# Patient Record
Sex: Female | Born: 1970
Health system: Southern US, Community
[De-identification: ages and names within clinical notes are randomized; demographics above are authoritative.]

## PROBLEM LIST (undated history)

## (undated) DIAGNOSIS — T4145XA Adverse effect of unspecified anesthetic, initial encounter: Secondary | ICD-10-CM

## (undated) DIAGNOSIS — N3010 Interstitial cystitis (chronic) without hematuria: Secondary | ICD-10-CM

## (undated) DIAGNOSIS — N289 Disorder of kidney and ureter, unspecified: Secondary | ICD-10-CM

## (undated) DIAGNOSIS — T8859XA Other complications of anesthesia, initial encounter: Secondary | ICD-10-CM

## (undated) DIAGNOSIS — R05 Cough: Secondary | ICD-10-CM

## (undated) DIAGNOSIS — Z9889 Other specified postprocedural states: Secondary | ICD-10-CM

## (undated) DIAGNOSIS — R112 Nausea with vomiting, unspecified: Secondary | ICD-10-CM

## (undated) DIAGNOSIS — K219 Gastro-esophageal reflux disease without esophagitis: Secondary | ICD-10-CM

## (undated) DIAGNOSIS — R053 Chronic cough: Secondary | ICD-10-CM

## (undated) DIAGNOSIS — F419 Anxiety disorder, unspecified: Secondary | ICD-10-CM

## (undated) DIAGNOSIS — H3320 Serous retinal detachment, unspecified eye: Secondary | ICD-10-CM

## (undated) HISTORY — DX: Chronic cough: R05.3

## (undated) HISTORY — DX: Anxiety disorder, unspecified: F41.9

## (undated) HISTORY — DX: Interstitial cystitis (chronic) without hematuria: N30.10

## (undated) HISTORY — DX: Cough: R05

## (undated) HISTORY — PX: EYE SURGERY: SHX253

---

## 1996-07-08 HISTORY — PX: APPENDECTOMY: SHX54

## 1998-01-12 ENCOUNTER — Other Ambulatory Visit: Admission: RE | Admit: 1998-01-12 | Discharge: 1998-01-12 | Payer: Self-pay | Admitting: Obstetrics and Gynecology

## 1999-01-26 ENCOUNTER — Other Ambulatory Visit: Admission: RE | Admit: 1999-01-26 | Discharge: 1999-01-26 | Payer: Self-pay | Admitting: Obstetrics and Gynecology

## 2000-02-15 ENCOUNTER — Other Ambulatory Visit: Admission: RE | Admit: 2000-02-15 | Discharge: 2000-02-15 | Payer: Self-pay | Admitting: Obstetrics and Gynecology

## 2000-06-01 ENCOUNTER — Emergency Department (HOSPITAL_COMMUNITY): Admission: EM | Admit: 2000-06-01 | Discharge: 2000-06-02 | Payer: Self-pay | Admitting: Emergency Medicine

## 2000-12-03 ENCOUNTER — Encounter: Payer: Self-pay | Admitting: Dermatology

## 2000-12-03 ENCOUNTER — Ambulatory Visit (HOSPITAL_COMMUNITY): Admission: RE | Admit: 2000-12-03 | Discharge: 2000-12-03 | Payer: Self-pay | Admitting: Dermatology

## 2000-12-17 ENCOUNTER — Encounter: Admission: RE | Admit: 2000-12-17 | Discharge: 2000-12-17 | Payer: Self-pay | Admitting: *Deleted

## 2000-12-17 ENCOUNTER — Encounter: Payer: Self-pay | Admitting: Allergy and Immunology

## 2001-02-28 ENCOUNTER — Emergency Department (HOSPITAL_COMMUNITY): Admission: EM | Admit: 2001-02-28 | Discharge: 2001-02-28 | Payer: Self-pay | Admitting: *Deleted

## 2001-03-10 ENCOUNTER — Other Ambulatory Visit: Admission: RE | Admit: 2001-03-10 | Discharge: 2001-03-10 | Payer: Self-pay | Admitting: Obstetrics and Gynecology

## 2001-06-23 ENCOUNTER — Ambulatory Visit (HOSPITAL_BASED_OUTPATIENT_CLINIC_OR_DEPARTMENT_OTHER): Admission: RE | Admit: 2001-06-23 | Discharge: 2001-06-23 | Payer: Self-pay | Admitting: *Deleted

## 2001-06-23 HISTORY — PX: OTHER SURGICAL HISTORY: SHX169

## 2002-03-31 ENCOUNTER — Encounter: Payer: Self-pay | Admitting: *Deleted

## 2002-03-31 ENCOUNTER — Encounter: Admission: RE | Admit: 2002-03-31 | Discharge: 2002-03-31 | Payer: Self-pay | Admitting: *Deleted

## 2002-04-14 ENCOUNTER — Other Ambulatory Visit: Admission: RE | Admit: 2002-04-14 | Discharge: 2002-04-14 | Payer: Self-pay | Admitting: Obstetrics and Gynecology

## 2003-05-04 ENCOUNTER — Other Ambulatory Visit: Admission: RE | Admit: 2003-05-04 | Discharge: 2003-05-04 | Payer: Self-pay | Admitting: Obstetrics and Gynecology

## 2004-03-28 ENCOUNTER — Emergency Department (HOSPITAL_COMMUNITY): Admission: EM | Admit: 2004-03-28 | Discharge: 2004-03-29 | Payer: Self-pay | Admitting: Emergency Medicine

## 2004-07-24 ENCOUNTER — Other Ambulatory Visit: Admission: RE | Admit: 2004-07-24 | Discharge: 2004-07-24 | Payer: Self-pay | Admitting: Obstetrics and Gynecology

## 2005-07-25 ENCOUNTER — Other Ambulatory Visit: Admission: RE | Admit: 2005-07-25 | Discharge: 2005-07-25 | Payer: Self-pay | Admitting: Obstetrics and Gynecology

## 2009-10-06 ENCOUNTER — Encounter: Admission: RE | Admit: 2009-10-06 | Discharge: 2009-10-06 | Payer: Self-pay | Admitting: Obstetrics and Gynecology

## 2010-04-30 ENCOUNTER — Encounter: Payer: Self-pay | Admitting: Obstetrics and Gynecology

## 2010-08-25 NOTE — H&P (Signed)
[facility]  Patient:    Debra Cain, Debra Cain Visit Number: [id number] MRN: [id number]          Service Type: Attending:  Radene Knee., M.D. Dictated by:   Radene Knee., M.D.                           History and Physical  DATE OF BIRTH:  12/11/1970  INTRODUCTION:  This patient, age 40, was initially seen in our office with a chief complaint of urgency and recurrent urinary tract infections, onset about six months.  She was felt to have interstitial cystitis and scheduled for cystoscopy, urethral dilation, and possible Clorpactin on June 23, 2001.  HISTORY OF PRESENT ILLNESS:  The patient did have urinary tract infections in the past, about 78.  These were treated with antibiotics, cleared up, and she did reasonably well until about six months ago.  Since then she has had recurrent episodes of urgency, frequency about every month and has been treated with antibiotics, most recently Macrobid, with only temporary improvement.  She gets up once a night, voids about every hour, has passed no gross blood, gravel, or stone.  When seen in our office her urinalysis was normal on June 19, 2001.  She was examined with ultrasound, which revealed no hydronephrosis right or left and was found to have a very tender bladder, advised to have cystoscopy and dilation under anesthesia, and this is scheduled for June 23, 2001, at [facility].  A urine culture is pending.  At this time she is to at continue her Macrobid.  ALLERGIES:  EPHEDRINE, MINOCIN, CIPRO, PENICILLIN, AUGMENTIN.  REGULAR MEDICATIONS:  Birth control pills.  Macrobid.  PAST MEDICAL HISTORY:  Unremarkable except for appendectomy.  FAMILY HISTORY:  No familial diseases to her knowledge.  Her father has recently had surgery for colitis.  He is in his 36s.  Mother living and well in her 43s.  One sibling, age 59.  To her knowledge no diabetes, high blood pressure, bleeders, or heart  disease in the family.  SOCIAL HISTORY:  She is married, has one child.  She uses a lot of caffeine. Does not use tobacco or alcohol.  She teaches clogging.  REVIEW OF SYSTEMS:  General health has been good.  Weight stable.  HEENT: Unremarkable except for headaches.  CARDIORESPIRATORY:  No chest pain or asthma.  GASTROINTESTINAL:  No hepatitis or peptic ulcer disease.  OB/GYN: One pregnancy.  Irregular menses, controlled with birth control pills. Menarche delayed to age 89.  EXTREMITIES:  No arthritis or edema. NEUROPSYCHIATRIC:  Unremarkable.  No fainting or falling-out spells.  Does have what sounds like tension headaches.  LYMPHATIC, HEMOPOIETIC:  No anemia or nodes.  SKIN:  No rashes.  She does have hives, however, with the above medications under allergies.  PHYSICAL EXAMINATION:  GENERAL:  Well-developed, well-nourished 40 year old female.  VITAL SIGNS:  Temperature 98, pulse 77, respirations 14, blood pressure 120/75.  Weight 111.  HEENT:  Ears and tympanic membranes unremarkable.  Eyes react normally to light and accommodation.  Extraocular movements intact.  Pharynx benign. Teeth in good repair.  NECK:  No enlargement of thyroid.  No nodes palpable.  CHEST:  Clear to percussion and auscultation.  HEART:  Normal sinus rhythm.  No murmur.  Not enlarged.  BREASTS:  Not examined.  ABDOMEN:  Flat.  There is suprapubic tenderness.  Liver, kidney, spleen, masses, hernia not detected.  GENITOURINARY:  External genitalia, meatus is normal.  She has good support to her bladder and urethra.  The bladder is quite tender.  RECTAL:  Rectal tone is good.  No masses noted.  EXTREMITIES:  No edema.  Good peripheral pulses.  NEUROLOGIC:  Grossly normal reflexes and sensation.  LYMPHATIC:  No nodes.  SKIN:  No lesions noted.  DIAGNOSES: 1. Recurrent urinary tract infections. 2. Chronic cystitis. 4. Rule out urethral stricture and interstitial cystitis.  PLAN:   Cystodilation, possible Clorpactin, Hermenia Fiscal, on June 23, 2001, 1 p.m.  Continue Macrobid 100 mg daily. Dictated by:   Radene Knee., M.D. Attending:  Radene Knee., M.D. DD:  06/19/01 TD:  06/20/01 Job: [id number]

## 2010-08-25 NOTE — Op Note (Signed)
[facility]  Patient:    Debra Cain, Debra Cain Visit Number: [id number] MRN: [id number]          Service Type: NES Location: [facility] Attending Physician:  Dalbert Mayotte Dictated by:   Radene Knee., M.D. Proc. Date: 06/23/01 Admit Date:  06/23/2001                             Operative Report  PREOPERATIVE DIAGNOSES:  Recurrent urinary tract infections, interstitial cystitis, urethral stricture.  POSTOPERATIVE DIAGNOSES:  Recurrent urinary tract infections, interstitial cystitis, urethral stricture.  OPERATION PERFORMED:  Cystoscopy, urethral dilation, hydrodistention and Clorpactin irrigation of bladder.  DESCRIPTION OF PROCEDURE:  This 40 year old female was prepared for surgery with IV gentamycin and underwent successful induction of general anesthesia and was prepped and draped in lithotomy position. Using female sounds, the urethra which was fairly tight was dilated 24-30 [place] with no significant bleeding. The bladder was then inspected with a #22 cystourethroscope using 70 and 12 degree lenses and initially the bladder looked surprisingly normal with only mild hyperemia and erythema. The right and left ureteral orifices were normal and there was no stone, tumor, nor ulcer noted. The bladder was then distended to capacity which was 500 ml and decompressed and she developed severe hyperemia and erythema throughout the bladder with large and small hemorrhages especially on the trigone in the anterior dome and on the lateral walls. The bladder was then emptied and irrigated with 1000 cc of Clorpactin solution and then emptied again and the patient returned to the recovery area in a stable condition.  The plan is for the patient to take Ceftin 250 mg for 2 or 3 days. She is to have Darvocet-N 100 to use p.r.n. for pain and she is to avoid caffeine forced fluids. She is to see [place] in the office and we will follow her up at that  time. ictated by:   Radene Knee., M.D. Attending Physician:  Dalbert Mayotte DD:  06/23/01 TD:  06/24/01 Job: [id number]

## 2011-06-04 ENCOUNTER — Ambulatory Visit: Payer: Self-pay | Admitting: Otolaryngology

## 2011-09-24 ENCOUNTER — Ambulatory Visit (INDEPENDENT_AMBULATORY_CARE_PROVIDER_SITE_OTHER): Payer: No Typology Code available for payment source | Admitting: Internal Medicine

## 2011-09-24 ENCOUNTER — Encounter: Payer: Self-pay | Admitting: Internal Medicine

## 2011-09-24 VITALS — BP 110/82 | HR 80 | Temp 98.4°F | Ht 65.0 in | Wt 123.0 lb

## 2011-09-24 DIAGNOSIS — T17308A Unspecified foreign body in larynx causing other injury, initial encounter: Secondary | ICD-10-CM | POA: Insufficient documentation

## 2011-09-24 DIAGNOSIS — F419 Anxiety disorder, unspecified: Secondary | ICD-10-CM

## 2011-09-24 DIAGNOSIS — F411 Generalized anxiety disorder: Secondary | ICD-10-CM

## 2011-09-24 DIAGNOSIS — N3010 Interstitial cystitis (chronic) without hematuria: Secondary | ICD-10-CM | POA: Insufficient documentation

## 2011-09-24 DIAGNOSIS — IMO0002 Reserved for concepts with insufficient information to code with codable children: Secondary | ICD-10-CM

## 2011-09-24 NOTE — Progress Notes (Signed)
  Subjective:    Patient ID: Debra Cain, female    DOB: 1970/08/31, 41 y.o.   MRN: [id number]  HPI  New pt to me - here to establish care - feels overall well Also follow with ob gyn, [facility] annually - all labs done thru their office  Reviewed chronic medical issues: Anxiety - on SNRI for same - feels stable on current dose - managed by gyn - no SI/HI, no irritability  Hx IC - symptoms stable at this time - on suppressive antibiotics (also for acne) - frequent UTIs but improved in past 5 years  Chronic cough/"choking" = has been evaluated by ENT and [facility] for same - ?related to allergies - not improved with daily PPI - no other anatomic issues found - eval planned by ST    Past Medical History  Diagnosis Date  . Chronic interstitial cystitis   . Anxiety    History reviewed. No pertinent family history.  History  Substance Use Topics  . Smoking status: Never Smoker   . Smokeless tobacco: Not on file  . Alcohol Use: No    Review of Systems Constitutional: Negative for fever or weight change.  Respiratory: Negative for cough and shortness of breath.   Cardiovascular: Negative for chest pain or palpitations.  Gastrointestinal: Negative for abdominal pain, no bowel changes.  Musculoskeletal: Negative for gait problem or joint swelling.  Skin: Negative for rash.  Neurological: Negative for dizziness or headache.  No other specific complaints in a complete review of systems (except as listed in HPI above).     Objective:   Physical Exam BP 110/82  Pulse 80  Temp 98.4 F (36.9 C) (Oral)  Ht 5\' 5"  (1.651 m)  Wt 123 lb (55.792 kg)  BMI 20.47 kg/m2  SpO2 94% Wt Readings from Last 3 Encounters:  09/24/11 123 lb (55.792 kg)   Constitutional: She appears well-developed and well-nourished. No distress. g-mother at side HENT: Head: Normocephalic and atraumatic. Ears: B TMs ok, no erythema or effusion; Nose: Nose normal. Mouth/Throat: Oropharynx is clear and moist. No  oropharyngeal exudate.  Eyes: Conjunctivae and EOM are normal. Pupils are equal, round, and reactive to light. No scleral icterus.  Neck: Normal range of motion. Neck supple. No JVD present. No thyromegaly present.  Cardiovascular: Normal rate, regular rhythm and normal heart sounds.  No murmur heard. No BLE edema. Pulmonary/Chest: Effort normal and breath sounds normal. No respiratory distress. She has no wheezes.  Musculoskeletal: Normal range of motion, no joint effusions. No gross deformities Neurological: She is alert and oriented to person, place, and time. No cranial nerve deficit. Coordination normal.  Skin: Skin is warm and dry. No rash noted. No erythema.  Psychiatric: She has a normal mood and affect. Her behavior is normal. Judgment and thought content normal.   No results found for this basename: WBC,  HGB,  HCT,  PLT,  GLUCOSE,  CHOL,  TRIG,  HDL,  LDLDIRECT,  LDLCALC,  ALT,  AST,  NA,  K,  CL,  CREATININE,  BUN,  CO2,  TSH,  PSA,  INR,  GLUF,  HGBA1C,  MICROALBUR       Assessment & Plan:  See problem list. Medications and labs reviewed today.

## 2011-09-24 NOTE — Assessment & Plan Note (Signed)
On SNRI since 2011 - managed by gyn -  The current medical regimen is effective;  continue present plan and medications.

## 2011-09-24 NOTE — Assessment & Plan Note (Signed)
Stable symptoms - UTI and flares managed by gyn at this time Remote uro procedure in 2003, no regular uro follow up The current medical regimen is effective;  continue present plan and medications.

## 2011-09-24 NOTE — Assessment & Plan Note (Signed)
Chronic cough/"choking" = has been evaluated by ENT and [facility] for same - ?related to allergies -  not improved with daily PPI - no other anatomic issues found - eval planned by ST  Support offered - consider pulm eval if desired in future

## 2011-09-24 NOTE — Patient Instructions (Signed)
It was good to see you today. We have reviewed your prior records including labs and tests today Medications reviewed, no changes at this time. Please schedule followup in 1-2 years for medical review and/or labs, call sooner if problems.

## 2012-02-20 ENCOUNTER — Ambulatory Visit (INDEPENDENT_AMBULATORY_CARE_PROVIDER_SITE_OTHER): Payer: No Typology Code available for payment source | Admitting: Internal Medicine

## 2012-02-20 ENCOUNTER — Other Ambulatory Visit (INDEPENDENT_AMBULATORY_CARE_PROVIDER_SITE_OTHER): Payer: No Typology Code available for payment source

## 2012-02-20 ENCOUNTER — Encounter: Payer: Self-pay | Admitting: Internal Medicine

## 2012-02-20 VITALS — BP 100/72 | HR 75 | Temp 98.3°F | Ht 65.0 in | Wt 122.0 lb

## 2012-02-20 DIAGNOSIS — R5381 Other malaise: Secondary | ICD-10-CM

## 2012-02-20 DIAGNOSIS — D649 Anemia, unspecified: Secondary | ICD-10-CM

## 2012-02-20 DIAGNOSIS — R5383 Other fatigue: Secondary | ICD-10-CM

## 2012-02-20 LAB — BASIC METABOLIC PANEL
Calcium: 9.2 mg/dL (ref 8.4–10.5)
Chloride: 101 mEq/L (ref 96–112)
Creatinine, Ser: 0.5 mg/dL (ref 0.4–1.2)

## 2012-02-20 LAB — CBC WITH DIFFERENTIAL/PLATELET
Basophils Relative: 0.4 % (ref 0.0–3.0)
Eosinophils Relative: 1.1 % (ref 0.0–5.0)
Lymphocytes Relative: 34.4 % (ref 12.0–46.0)
Monocytes Relative: 6.4 % (ref 3.0–12.0)
Neutrophils Relative %: 57.7 % (ref 43.0–77.0)
RBC: 4.71 Mil/uL (ref 3.87–5.11)
WBC: 6.5 10*3/uL (ref 4.5–10.5)

## 2012-02-20 LAB — HEPATIC FUNCTION PANEL
AST: 15 U/L (ref 0–37)
Albumin: 4.2 g/dL (ref 3.5–5.2)
Alkaline Phosphatase: 66 U/L (ref 39–117)
Total Protein: 7.3 g/dL (ref 6.0–8.3)

## 2012-02-20 LAB — FERRITIN: Ferritin: 26.4 ng/mL (ref 10.0–291.0)

## 2012-02-20 NOTE — Patient Instructions (Signed)
It was good to see you today. Test(s) ordered today. Your results will be released to MyChart (or called to you) after review, usually within 72hours after test completion. If any changes need to be made, you will be notified at that same time. Medications reviewed, no changes at this time. Will consider prescription iron or b12 shots if labs show low levels

## 2012-02-20 NOTE — Progress Notes (Signed)
  Subjective:    Patient ID: Debra Cain, female    DOB: 1971/02/27, 41 y.o.   MRN: [id number]  HPI   complains of fatigue - physical > emotional Ongoing >3 mo associated with hypersomnia, poor appetite Hx anemia with B12 and iron deficiency - ?recheck now Denies SI/HI or depression symptoms   Reviewed chronic medical issues: Anxiety - on SNRI for same - feels stable on current dose - managed by gyn - denies irritability  Hx IC - symptoms stable at this time - on suppressive antibiotics (also for acne) - frequent UTIs but improved in past 5 years  Chronic cough/"choking" = has been evaluated by ENT and [facility] for same - ?related to allergies - not improved with daily PPI - no other anatomic issues found -   Past Medical History  Diagnosis Date  . Chronic interstitial cystitis   . Anxiety     Review of Systems  Constitutional: Negative for fever or weight change. complains of fatigue Respiratory: Negative for cough and shortness of breath.   Cardiovascular: Negative for chest pain or palpitations.      Objective:   Physical Exam  BP 100/72  Pulse 75  Temp 98.3 F (36.8 C) (Oral)  Ht 5\' 5"  (1.651 m)  Wt 122 lb (55.339 kg)  BMI 20.30 kg/m2  SpO2 97% Wt Readings from Last 3 Encounters:  02/20/12 122 lb (55.339 kg)  09/24/11 123 lb (55.792 kg)   Constitutional: She appears well-developed and well-nourished. No distress. Neck: Normal range of motion. Neck supple. No JVD present. No thyromegaly present.  Cardiovascular: Normal rate, regular rhythm and normal heart sounds.  No murmur heard. No BLE edema. Pulmonary/Chest: Effort normal and breath sounds normal. No respiratory distress. She has no wheezes.  Skin: Skin is warm and dry. No rash noted. No erythema.  Psychiatric: She has a normal mood and affect. Her behavior is normal. Judgment and thought content normal.   No results found for this basename: WBC,  HGB,  HCT,  PLT,  GLUCOSE,  CHOL,  TRIG,  HDL,  LDLDIRECT,   LDLCALC,  ALT,  AST,  NA,  K,  CL,  CREATININE,  BUN,  CO2,  TSH,  PSA,  INR,  GLUF,  HGBA1C,  MICROALBUR   No results found for this basename: VITAMINB12       Assessment & Plan:   Fatigue - nonspecific symptoms/exam - check screening labs, reports hx B12 and iron deficiency with anemia   Anemia - hx iron def and B12 def - check levels now

## 2012-02-21 LAB — VITAMIN D 25 HYDROXY (VIT D DEFICIENCY, FRACTURES): Vit D, 25-Hydroxy: 42 ng/mL (ref 30–89)

## 2012-04-12 ENCOUNTER — Emergency Department: Payer: Self-pay | Admitting: Emergency Medicine

## 2012-04-12 LAB — URINALYSIS, COMPLETE
Bilirubin,UR: NEGATIVE
Leukocyte Esterase: NEGATIVE
RBC,UR: 1 /HPF (ref 0–5)
Specific Gravity: 1.006 (ref 1.003–1.030)
Squamous Epithelial: 2
WBC UR: <1 /HPF (ref 0–5)

## 2012-04-12 LAB — COMPREHENSIVE METABOLIC PANEL
Albumin: 4.6 g/dL (ref 3.4–5.0)
Alkaline Phosphatase: 102 U/L (ref 50–136)
Chloride: 101 mmol/L (ref 98–107)
Co2: 22 mmol/L (ref 21–32)
Creatinine: 0.81 mg/dL (ref 0.60–1.30)
EGFR (African American): >60
EGFR (Non-African Amer.): >60
Osmolality: 273 (ref 275–301)
Potassium: 3.2 mmol/L — ABNORMAL LOW (ref 3.5–5.1)
SGPT (ALT): 16 U/L (ref 12–78)
Sodium: 136 mmol/L (ref 136–145)
Total Protein: 8.4 g/dL — ABNORMAL HIGH (ref 6.4–8.2)

## 2012-04-12 LAB — CBC
HGB: 14.9 g/dL (ref 12.0–16.0)
MCH: 30.2 pg (ref 26.0–34.0)
MCHC: 36.4 g/dL — ABNORMAL HIGH (ref 32.0–36.0)
MCV: 83 fL (ref 80–100)
Platelet: 347 10*3/uL (ref 150–440)
RBC: 4.92 10*6/uL (ref 3.80–5.20)
RDW: 12 % (ref 11.5–14.5)
WBC: 7 10*3/uL (ref 3.6–11.0)

## 2012-04-12 LAB — TROPONIN I: Troponin-I: <0.02 ng/mL

## 2012-04-13 LAB — TSH: Thyroid Stimulating Horm: 2.1 u[IU]/mL

## 2012-04-21 ENCOUNTER — Ambulatory Visit (INDEPENDENT_AMBULATORY_CARE_PROVIDER_SITE_OTHER): Payer: No Typology Code available for payment source | Admitting: Internal Medicine

## 2012-04-21 ENCOUNTER — Encounter: Payer: Self-pay | Admitting: Internal Medicine

## 2012-04-21 VITALS — BP 112/78 | HR 90 | Temp 98.2°F | Ht 65.0 in | Wt 124.6 lb

## 2012-04-21 DIAGNOSIS — F411 Generalized anxiety disorder: Secondary | ICD-10-CM

## 2012-04-21 DIAGNOSIS — F419 Anxiety disorder, unspecified: Secondary | ICD-10-CM

## 2012-04-21 MED ORDER — ALPRAZOLAM 0.5 MG PO TABS: 0.5000 mg | ORAL_TABLET | Freq: Two times a day (BID) | ORAL | Status: DC | PRN

## 2012-04-21 NOTE — Assessment & Plan Note (Signed)
On SNRI since 2011 - managed by gyn -  Increase symptoms with panic attacks 03/2012 during holiday, triggered by family stressors Panic attack with ER visit at [facility] reviewed Start low dose xanax as needed in addition to ongoing SNRI (unable to tol higher dose SNRI due to weight gain and nausea) Offered counseling and pt denies need for same at this time due to ineffective prior trials Support offered

## 2012-04-21 NOTE — Patient Instructions (Signed)
It was good to see you today. We have reviewed your prior records including labs and tests today Medications reviewed, use xanax as needed for anxiety/panic attack symptoms - Your prescription(s) have been submitted to your pharmacy. Please take as directed and contact our office if you believe you are having problem(s) with the medication(s). Continue daily effexor for "baseline" control Please schedule followup in 6 months, call sooner if problems.    Anxiety and Panic Attacks Your caregiver has informed you that you are having an anxiety or panic attack. There may be many forms of this. Most of the time these attacks come suddenly and without warning. They come at any time of day, including periods of sleep, and at any time of life. They may be strong and unexplained. Although panic attacks are very scary, they are physically harmless. Sometimes the cause of your anxiety is not known. Anxiety is a protective mechanism of the body in its fight or flight mechanism. Most of these perceived danger situations are actually nonphysical situations (such as anxiety over losing a job). CAUSES   The causes of an anxiety or panic attack are many. Panic attacks may occur in otherwise healthy people given a certain set of circumstances. There may be a genetic cause for panic attacks. Some medications may also have anxiety as a side effect. SYMPTOMS   Some of the most common feelings are:  Intense terror.   Dizziness, feeling faint.   Hot and cold flashes.   Fear of going crazy.   Feelings that nothing is real.   Sweating.   Shaking.   Chest pain or a fast heartbeat (palpitations).   Smothering, choking sensations.   Feelings of impending doom and that death is near.   Tingling of extremities, this may be from over-breathing.   Altered reality (derealization).   Being detached from yourself (depersonalization).  Several symptoms can be present to make up anxiety or panic attacks. DIAGNOSIS    The evaluation by your caregiver will depend on the type of symptoms you are experiencing. The diagnosis of anxiety or panic attack is made when no physical illness can be determined to be a cause of the symptoms. TREATMENT   Treatment to prevent anxiety and panic attacks may include:  Avoidance of circumstances that cause anxiety.   Reassurance and relaxation.   Regular exercise.   Relaxation therapies, such as yoga.   Psychotherapy with a psychiatrist or therapist.   Avoidance of caffeine, alcohol and illegal drugs.   Prescribed medication.  SEEK IMMEDIATE MEDICAL CARE IF:    You experience panic attack symptoms that are different than your usual symptoms.   You have any worsening or concerning symptoms.  Document Released: 03/26/2005 Document Revised: 06/18/2011 Document Reviewed: 07/28/2009 [facility] Patient Information 2013 [place], [place].

## 2012-04-21 NOTE — Progress Notes (Signed)
  Subjective:    Patient ID: Debra Cain, female    DOB: [birth year], 42 y.o.   MRN: [id number]  HPI  Here for follow up - reviewed chronic medical issues:  Anxiety - on SNRI for same since 2010 - exacerbated by family stressors over holiday 2013, but feels stable overall on current dose - managed by gyn - denies irritability - prior counseling events ineffective  Hx IC - symptoms stable at this time - on suppressive antibiotics (also for acne) - frequent UTIs but improved in past 5 years  Chronic cough/"choking" = has been evaluated by ENT and [facility] for same - ?related to allergies - not improved with daily PPI - no other anatomic issues found -   Past Medical History  Diagnosis Date  . Chronic interstitial cystitis   . Anxiety     Review of Systems  Constitutional: Negative for fever or weight change. Respiratory: Negative for cough and shortness of breath.   Cardiovascular: Negative for chest pain or palpitations.      Objective:   Physical Exam  BP 112/78  Pulse 90  Temp 98.2 F (36.8 C) (Oral)  Ht 5\' 5"  (1.651 m)  Wt 124 lb 9.6 oz (56.518 kg)  BMI 20.73 kg/m2  SpO2 94% Wt Readings from Last 3 Encounters:  04/21/12 124 lb 9.6 oz (56.518 kg)  02/20/12 122 lb (55.339 kg)  09/24/11 123 lb (55.792 kg)   Constitutional: She appears well-developed and well-nourished. No distress. Neck: Normal range of motion. Neck supple. No JVD present. No thyromegaly present.  Cardiovascular: Normal rate, regular rhythm and normal heart sounds.  No murmur heard. No BLE edema. Pulmonary/Chest: Effort normal and breath sounds normal. No respiratory distress. She has no wheezes.  Skin: Skin is warm and dry. No rash noted. No erythema.  Psychiatric: She has a anxious and tearful mood and affect. Her behavior is normal. Judgment and thought content normal.   Lab Results  Component Value Date   WBC 6.5 02/20/2012   HGB 13.6 02/20/2012   HCT 41.3 02/20/2012   PLT 323.0 02/20/2012   GLUCOSE 115* 02/20/2012   ALT 11 02/20/2012   AST 15 02/20/2012   NA 136 02/20/2012   K 4.6 02/20/2012   CL 101 02/20/2012   CREATININE 0.5 02/20/2012   BUN 11 02/20/2012   CO2 28 02/20/2012   TSH 1.14 02/20/2012    Lab Results  Component Value Date   VITAMINB12 962* 02/20/2012       Assessment & Plan:   See problem list. Medications and labs reviewed today.

## 2013-09-29 ENCOUNTER — Telehealth: Payer: Self-pay | Admitting: *Deleted

## 2013-09-29 NOTE — Telephone Encounter (Signed)
Left msg on triage requesting referral to see Dr. Melvyn Novas concerning her choking issues. My sister has been referred to him and he has help her out with sxs. Called pt back inform her she will need to see PCP b4 referral can be made. Made appt for 7/13...Johny Chess

## 2013-09-30 ENCOUNTER — Other Ambulatory Visit: Payer: Self-pay | Admitting: *Deleted

## 2013-09-30 MED ORDER — ALPRAZOLAM 0.5 MG PO TABS: 0.5000 mg | ORAL_TABLET | Freq: Two times a day (BID) | ORAL | Status: DC | PRN

## 2013-09-30 NOTE — Telephone Encounter (Signed)
MD out of office. Pls advise on refill.../lmb 

## 2013-09-30 NOTE — Telephone Encounter (Signed)
Done hardcopy to robin  

## 2013-09-30 NOTE — Telephone Encounter (Signed)
Faxed hardcopy to [employer]

## 2013-10-19 ENCOUNTER — Ambulatory Visit (INDEPENDENT_AMBULATORY_CARE_PROVIDER_SITE_OTHER): Payer: No Typology Code available for payment source | Admitting: Internal Medicine

## 2013-10-19 ENCOUNTER — Encounter: Payer: Self-pay | Admitting: Internal Medicine

## 2013-10-19 ENCOUNTER — Other Ambulatory Visit (INDEPENDENT_AMBULATORY_CARE_PROVIDER_SITE_OTHER): Payer: No Typology Code available for payment source

## 2013-10-19 VITALS — BP 110/80 | HR 59 | Temp 98.1°F | Ht 65.0 in | Wt 126.0 lb

## 2013-10-19 DIAGNOSIS — Z5189 Encounter for other specified aftercare: Secondary | ICD-10-CM

## 2013-10-19 DIAGNOSIS — R5383 Other fatigue: Secondary | ICD-10-CM

## 2013-10-19 DIAGNOSIS — F411 Generalized anxiety disorder: Secondary | ICD-10-CM

## 2013-10-19 DIAGNOSIS — F419 Anxiety disorder, unspecified: Secondary | ICD-10-CM

## 2013-10-19 DIAGNOSIS — R5381 Other malaise: Secondary | ICD-10-CM

## 2013-10-19 DIAGNOSIS — Z136 Encounter for screening for cardiovascular disorders: Secondary | ICD-10-CM

## 2013-10-19 DIAGNOSIS — T17308D Unspecified foreign body in larynx causing other injury, subsequent encounter: Secondary | ICD-10-CM

## 2013-10-19 DIAGNOSIS — T17308A Unspecified foreign body in larynx causing other injury, initial encounter: Secondary | ICD-10-CM

## 2013-10-19 LAB — CBC WITH DIFFERENTIAL/PLATELET
BASOS ABS: 0 10*3/uL (ref 0.0–0.1)
Basophils Relative: 0.6 % (ref 0.0–3.0)
EOS ABS: 0.1 10*3/uL (ref 0.0–0.7)
Eosinophils Relative: 1.5 % (ref 0.0–5.0)
HCT: 39.8 % (ref 36.0–46.0)
Hemoglobin: 13.1 g/dL (ref 12.0–15.0)
Lymphocytes Relative: 24.4 % (ref 12.0–46.0)
Lymphs Abs: 1.8 10*3/uL (ref 0.7–4.0)
MCHC: 33 g/dL (ref 30.0–36.0)
MCV: 85.5 fl (ref 78.0–100.0)
MONO ABS: 0.5 10*3/uL (ref 0.1–1.0)
Monocytes Relative: 7.3 % (ref 3.0–12.0)
NEUTROS PCT: 66.2 % (ref 43.0–77.0)
Neutro Abs: 4.8 10*3/uL (ref 1.4–7.7)
Platelets: 329 10*3/uL (ref 150.0–400.0)
RBC: 4.65 Mil/uL (ref 3.87–5.11)
RDW: 12.1 % (ref 11.5–15.5)
WBC: 7.3 10*3/uL (ref 4.0–10.5)

## 2013-10-19 MED ORDER — OMEPRAZOLE 20 MG PO CPDR: 20.0000 mg | DELAYED_RELEASE_CAPSULE | Freq: Every day | ORAL | Status: DC

## 2013-10-19 MED ORDER — LORATADINE 10 MG PO TABS: 10.0000 mg | ORAL_TABLET | Freq: Every day | ORAL | Status: DC

## 2013-10-19 NOTE — Patient Instructions (Signed)
It was good to see you today.  We have reviewed your prior records including labs and tests today  Test(s) ordered today. Your results will be released to [place] (or called to you) after review, usually within 72hours after test completion. If any changes need to be made, you will be notified at that same time.  Medications reviewed and updated, no changes recommended at this time.  Please schedule followup in 6-12 months, call sooner if problems.

## 2013-10-19 NOTE — Assessment & Plan Note (Signed)
On SNRI since 2011, changed to SSRI low ose sertraline due to weight gain - managed by gyn -  Increase symptoms with panic attacks 03/2012 during holiday, triggered by family stressors (panic attack with ER visit at [facility] reviewed) rx'd low dose xanax as needed 04/2013 - infrequent use of same Offered counseling and pt denies need for same at this time due to ineffective prior trials

## 2013-10-19 NOTE — Assessment & Plan Note (Signed)
Chronic cough/"choking" = has been evaluated by ENT, ST at [facility] for same - ?related to allergies -  Reports not previosuly improved with daily PPI or antihistamines - no other anatomic issues found per report To resume same (PPI and claritin) daily and refer to Dr Melvyn Novas as per request  Support offered -

## 2013-10-19 NOTE — Progress Notes (Signed)
Pre visit review using our clinic review tool, if applicable. No additional management support is needed unless otherwise documented below in the visit note. 

## 2013-10-19 NOTE — Progress Notes (Signed)
Subjective:    Patient ID: Debra Cain, female    DOB: [birth year], 43 y.o.   MRN: [id number]  HPI  Patient is here for follow up  Reviewed chronic medical issues and interval medical events  Past Medical History  Diagnosis Date  . Chronic interstitial cystitis   . Anxiety     Review of Systems  Constitutional: Positive for chills and fatigue. Negative for fever.  Respiratory: Positive for choking (request Dr Melvyn Novas to eval same - prior ENT eval unremarkable and PPI not helpful). Negative for shortness of breath.   Cardiovascular: Negative for leg swelling.  Neurological: Positive for numbness.  Psychiatric/Behavioral: Negative for suicidal ideas and self-injury. The patient is nervous/anxious.        Objective:   Physical Exam  BP 110/80  Pulse 59  Temp(Src) 98.1 F (36.7 C) (Oral)  Ht 5\' 5"  (1.651 m)  Wt 126 lb (57.153 kg)  BMI 20.97 kg/m2  SpO2 99% Wt Readings from Last 3 Encounters:  10/19/13 126 lb (57.153 kg)  04/21/12 124 lb 9.6 oz (56.518 kg)  02/20/12 122 lb (55.339 kg)   Constitutional: She appears well-developed and well-nourished. No distress.  Neck: Normal range of motion. Neck supple. No JVD present. No thyromegaly present.  Cardiovascular: Normal rate, regular rhythm and normal heart sounds.  No murmur heard. No BLE edema. Pulmonary/Chest: Effort normal and breath sounds normal. No respiratory distress. She has no wheezes.  Psychiatric: She has an anxious mood and affect. Her behavior is normal. Judgment and thought content normal.   Lab Results  Component Value Date   WBC 6.5 02/20/2012   HGB 13.6 02/20/2012   HCT 41.3 02/20/2012   PLT 323.0 02/20/2012   GLUCOSE 115* 02/20/2012   ALT 11 02/20/2012   AST 15 02/20/2012   NA 136 02/20/2012   K 4.6 02/20/2012   CL 101 02/20/2012   CREATININE 0.5 02/20/2012   BUN 11 02/20/2012   CO2 28 02/20/2012   TSH 1.14 02/20/2012    US Breast Right  10/06/2009   Clinical Data:  Right breast nodule 10  o'clock position. In the office today, the patient describes a large area in the 10 o'clock region of the right breast which she feels is asymmetric compared to the left breast.   DIGITAL DIAGNOSTIC BILATERAL MAMMOGRAM WITH CAD AND RIGHT BREAST ULTRASOUND:   Comparison:  None.   Findings:  Breast parenchyma is extremely dense bilaterally.  In the region of palpable concern, on a spot tangential view, there is suggestion of a obscured oval mass in the superficial aspect of the breast parenchyma.   No other mass is identified in either breast.  No suspicious microcalcification or distortion is seen. Mammographic images were processed with CAD.   On physical exam, dense breast parenchyma is palpated in the upper outer quadrant of the right breast.  The 10 o'clock region is slightly firm compared to the left breast.  I do not palpate a discrete lump.   Ultrasound is performed, showing  an oval homogeneous hypoechoic lobulated mass with some internal echogenic bands at 9:30 position, approximately 3 cm from the nipple that measures 1.1 x 0.5 x 1.0 cm.  There is posterior acoustic enhancement.  This has imaging features suggestive of a benign fibroadenoma. This mass is not easily palpable.   IMPRESSION: Probable 1.1 cm fibroadenoma 9:30 position right breast.  The options of follow-up ultrasound in 6, 12 and 24 months to assess for stability, versus ultrasound-guided core needle biopsy  versus surgical excision were discussed with the patient. She opts for 6 month follow-up ultrasound at this time.   Right breast ultrasound in 6 months is recommended.   BI-RADS CATEGORY 3:  Probably benign finding(s) - short interval follow-up suggested.  Provider: Lizabeth Leyden  Mm Digital Diagnostic Bilat  10/06/2009   Clinical Data:  Right breast nodule 10 o'clock position. In the office today, the patient describes a large area in the 10 o'clock region of the right breast which she feels is asymmetric compared to the left breast.    DIGITAL DIAGNOSTIC BILATERAL MAMMOGRAM WITH CAD AND RIGHT BREAST ULTRASOUND:   Comparison:  None.   Findings:  Breast parenchyma is extremely dense bilaterally.  In the region of palpable concern, on a spot tangential view, there is suggestion of a obscured oval mass in the superficial aspect of the breast parenchyma.   No other mass is identified in either breast.  No suspicious microcalcification or distortion is seen. Mammographic images were processed with CAD.   On physical exam, dense breast parenchyma is palpated in the upper outer quadrant of the right breast.  The 10 o'clock region is slightly firm compared to the left breast.  I do not palpate a discrete lump.   Ultrasound is performed, showing  an oval homogeneous hypoechoic lobulated mass with some internal echogenic bands at 9:30 position, approximately 3 cm from the nipple that measures 1.1 x 0.5 x 1.0 cm.  There is posterior acoustic enhancement.  This has imaging features suggestive of a benign fibroadenoma. This mass is not easily palpable.   IMPRESSION: Probable 1.1 cm fibroadenoma 9:30 position right breast.  The options of follow-up ultrasound in 6, 12 and 24 months to assess for stability, versus ultrasound-guided core needle biopsy versus surgical excision were discussed with the patient. She opts for 6 month follow-up ultrasound at this time.   Right breast ultrasound in 6 months is recommended.   BI-RADS CATEGORY 3:  Probably benign finding(s) - short interval follow-up suggested.  Provider: Siri Cole      Assessment & Plan:   Fatigue - overlap with "tingling all over on exertion, esp hands with clogging", "choking" spells and poor sleep - appears anxious but otherwise nonspecific exam - check screening labs - consider neuro eval   Reports prior "complete hormone deficiency" requiring BCP and testosterone replacement - but has been off same > 59mo - recheck labs now and consider endo eval if abnormal  Tingle/numbness -esp hands  with clogging - consider neuro or c-spine eval if labs abnormal  Problem List Items Addressed This Visit   Anxiety     On SNRI since 2011, changed to SSRI low ose sertraline due to weight gain - managed by gyn -  Increase symptoms with panic attacks 03/2012 during holiday, triggered by family stressors (panic attack with ER visit at [facility] reviewed) rx'd low dose xanax as needed 04/2013 - infrequent use of same Offered counseling and pt denies need for same at this time due to ineffective prior trials    Relevant Medications      sertraline (ZOLOFT) 25 MG tablet   Choking     Chronic cough/"choking" = has been evaluated by ENT, [facility] at [facility] for same - ?related to allergies -  Reports not previosuly improved with daily PPI or antihistamines - no other anatomic issues found per report To resume same (PPI and claritin) daily and refer to Dr Melvyn Novas as per request  Support offered -  Relevant Orders      Ambulatory referral to Pulmonology    Other Visit Diagnoses   Other fatigue    -  Primary    Relevant Orders       Basic metabolic panel       CBC with Differential       Hepatic function panel       TSH       Vitamin B12       Ferritin       Testosterone, free, total       Follicle stimulating hormone       DHEA-sulfate    Screening for ischemic heart disease        Relevant Orders       Lipid panel

## 2013-10-20 LAB — BASIC METABOLIC PANEL
BUN: 11 mg/dL (ref 6–23)
CALCIUM: 9 mg/dL (ref 8.4–10.5)
CO2: 26 meq/L (ref 19–32)
CREATININE: 0.5 mg/dL (ref 0.4–1.2)
Chloride: 105 mEq/L (ref 96–112)
GFR: 140.86 mL/min (ref >60.00)
Glucose, Bld: 87 mg/dL (ref 70–99)
Potassium: 4.1 mEq/L (ref 3.5–5.1)
Sodium: 139 mEq/L (ref 135–145)

## 2013-10-20 LAB — VITAMIN B12: VITAMIN B 12: 402 pg/mL (ref 211–911)

## 2013-10-20 LAB — HEPATIC FUNCTION PANEL
ALT: 13 U/L (ref 0–35)
AST: 17 U/L (ref 0–37)
Albumin: 4.1 g/dL (ref 3.5–5.2)
Alkaline Phosphatase: 68 U/L (ref 39–117)
BILIRUBIN DIRECT: 0.1 mg/dL (ref 0.0–0.3)
Total Bilirubin: 0.5 mg/dL (ref 0.2–1.2)
Total Protein: 7.3 g/dL (ref 6.0–8.3)

## 2013-10-20 LAB — TESTOSTERONE, FREE, TOTAL, SHBG
Sex Hormone Binding: 75 nmol/L (ref 18–114)
Testosterone, Free: 4.6 pg/mL (ref 0.6–6.8)
Testosterone-% Free: 1 % (ref 0.4–2.4)
Testosterone: 45 ng/dL (ref 10–70)

## 2013-10-20 LAB — LIPID PANEL
CHOLESTEROL: 166 mg/dL (ref 0–200)
HDL: 47.9 mg/dL (ref >39.00)
LDL Cholesterol: 110 mg/dL — ABNORMAL HIGH (ref 0–99)
NonHDL: 118.1
Total CHOL/HDL Ratio: 3
Triglycerides: 39 mg/dL (ref 0.0–149.0)
VLDL: 7.8 mg/dL (ref 0.0–40.0)

## 2013-10-20 LAB — TSH: TSH: 1.69 u[IU]/mL (ref 0.35–4.50)

## 2013-10-20 LAB — DHEA-SULFATE: DHEA-SO4: 174 ug/dL (ref 35–430)

## 2013-10-20 LAB — FERRITIN: FERRITIN: 17.2 ng/mL (ref 10.0–291.0)

## 2013-10-20 LAB — FOLLICLE STIMULATING HORMONE: FSH: 9.1 m[IU]/mL

## 2013-10-22 ENCOUNTER — Telehealth: Payer: Self-pay | Admitting: *Deleted

## 2013-10-22 NOTE — Telephone Encounter (Signed)
Pt called requesting labs that was done on 10/19/13. Gave md response inform pt they have mail letter out as well with results.Marland KitchenJohny Cain

## 2013-10-23 ENCOUNTER — Telehealth: Payer: Self-pay | Admitting: Internal Medicine

## 2013-10-23 NOTE — Telephone Encounter (Signed)
Rec'd from [facility] forward 3 pages to Dr. Asa Lente

## 2013-11-02 ENCOUNTER — Ambulatory Visit (INDEPENDENT_AMBULATORY_CARE_PROVIDER_SITE_OTHER)
Admission: RE | Admit: 2013-11-02 | Discharge: 2013-11-02 | Disposition: A | Discharge status: Home / Self Care | Payer: No Typology Code available for payment source | Source: Ambulatory Visit | Attending: Internal Medicine | Admitting: Internal Medicine

## 2013-11-02 ENCOUNTER — Encounter: Payer: Self-pay | Admitting: Internal Medicine

## 2013-11-02 ENCOUNTER — Ambulatory Visit (INDEPENDENT_AMBULATORY_CARE_PROVIDER_SITE_OTHER): Payer: No Typology Code available for payment source | Admitting: Internal Medicine

## 2013-11-02 VITALS — BP 106/70 | HR 93 | Temp 98.0°F | Ht 64.5 in | Wt 125.0 lb

## 2013-11-02 DIAGNOSIS — R059 Cough, unspecified: Secondary | ICD-10-CM

## 2013-11-02 DIAGNOSIS — R05 Cough: Secondary | ICD-10-CM

## 2013-11-02 DIAGNOSIS — R058 Other specified cough: Secondary | ICD-10-CM

## 2013-11-02 MED ORDER — GABAPENTIN 100 MG PO CAPS: 100.0000 mg | ORAL_CAPSULE | Freq: Three times a day (TID) | ORAL | Status: DC

## 2013-11-02 MED ORDER — PANTOPRAZOLE SODIUM 40 MG PO TBEC: 40.0000 mg | DELAYED_RELEASE_TABLET | Freq: Every day | ORAL | Status: DC

## 2013-11-02 NOTE — Progress Notes (Signed)
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    11/02/2013 1st [facility] office visit/ Debra Cain  Chief Complaint  Patient presents with  . Pulmonary Consult    Self referral. Pt c/o cough x 5 yrs.   Cough is non prod, sometimes until the point of vomiting.  Worse while eating and is triggered by certain smells.   onset was insidious with lots of throat clearing, pattern has been persistent x 5 y  [facility]/ ent dx was acid reflux rx x years and no better. [facility] w/u also unhelpful, does not remember what dept but sent for speech therapy and failed to keep appt. Cough is dry more than wet, day >  Night, chokes when eats lunch, p smells like at work. Rarely does gets sob unless coughing maybe once a month with clogging, better p saba  eia in hs only soccer and cheerleading Allergy testing was neg on [address]  Started taking h1 and h2 can't really tell much yet     Kouffman Reflux v Neurogenic Cough Differentiator Reflux Comments  Do you awaken from a sound sleep coughing violently?                            With trouble breathing? Rare    Do you have choking episodes when you cannot  Get enough air, gasping for air ?              Yes   Do you usually cough when you lie down into  The bed, or when you just lie down to rest ?                          no   Do you usually cough after meals or eating?         definitely   Do you cough when (or after) you bend over?    Yes   GERD SCORE     Kouffman Reflux v Neurogenic Cough Differentiator Neurogenic   Do you more-or-less cough all day long? Clears throat   Does change of temperature make you cough? Yes,heat   Does laughing or chuckling cause you to cough? yes   Do fumes (perfume, automobile fumes, burned  Toast, etc.,) cause you to cough ?      yes   Does speaking,  singing, or talking on the phone cause you to cough   ?               yes   Neurogenic/Airway score         Review of Systems  Constitutional: Negative for fever, chills and unexpected weight change.  HENT: Negative for congestion, dental problem, ear pain, nosebleeds, postnasal drip, rhinorrhea, sinus pressure, sneezing, sore throat, trouble swallowing and voice change.   Eyes: Negative for visual disturbance.  Respiratory: Positive for cough. Negative for choking and shortness of breath.   Cardiovascular: Negative for chest pain and leg swelling.  Gastrointestinal: Negative for vomiting, abdominal pain and diarrhea.  Genitourinary: Negative for difficulty urinating.  Musculoskeletal: Negative for arthralgias.  Skin: Negative for rash.  Neurological: Negative for tremors, syncope and headaches.  Hematological: Does not bruise/bleed easily.  Objective:   Physical Exam  amb wf with classic voice fatigue  Wt Readings from Last 3 Encounters:  11/02/13 125 lb (56.7 kg)  10/19/13 126 lb (57.153 kg)  04/21/12 124 lb 9.6 oz (56.518 kg)      HEENT: nl dentition, turbinates, and orophanx. Nl external ear canals without cough reflex   NECK :  without JVD/Nodes/TM/ nl carotid upstrokes bilaterally   LUNGS: no acc muscle use, clear to A and P bilaterally with  cough on insp  maneuvers   CV:  RRR  no s3 or murmur or increase in P2, no edema   ABD:  soft and nontender with nl excursion in the supine position. No bruits or organomegaly, bowel sounds nl  MS:  warm without deformities, calf tenderness, cyanosis or clubbing  SKIN: warm and dry without lesions    NEURO:  alert, approp, no deficits    11/03/23 chest X-ray  wnl     Assessment & Plan:

## 2013-11-02 NOTE — Patient Instructions (Signed)
Pantoprazole (protonix) 40 mg   Take 30-60 min before first meal of the day and Pepcid 20 mg one bedtime until return to office and chlortrimeton 4 mg at bedtime- this is the best way to tell whether stomach acid is contributing to your problem.     GERD (REFLUX)  is an extremely common cause of respiratory symptoms, many times with no significant heartburn at all.    It can be treated with medication, but also with lifestyle changes including avoidance of late meals, excessive alcohol, smoking cessation, and avoid fatty foods, chocolate, peppermint, colas, red wine, and acidic juices such as orange juice.  NO MINT OR MENTHOL PRODUCTS SO NO COUGH DROPS  USE SUGARLESS CANDY INSTEAD (jolley ranchers or Stover's)  NO OIL BASED VITAMINS - use powdered substitutes.  Gabapentin 100 mg three times a day   Please remember to go to the  x-ray department downstairs for your tests - we will call you with the results when they are available.  Schedule an appt in 2 weeks at your convenience when we can do heavy cough suppression.

## 2013-11-03 NOTE — Progress Notes (Signed)
Quick Note:  Spoke with pt and notified of results per Dr. Wert. Pt verbalized understanding and denied any questions.  ______ 

## 2013-11-03 NOTE — Assessment & Plan Note (Signed)
The most common causes of chronic cough in immunocompetent adults include the following: upper airway cough syndrome (UACS), previously referred to as postnasal drip syndrome (PNDS), which is caused by variety of rhinosinus conditions; (2) asthma; (3) GERD; (4) chronic bronchitis from cigarette smoking or other inhaled environmental irritants; (5) nonasthmatic eosinophilic bronchitis; and (6) bronchiectasis.   These conditions, singly or in combination, have accounted for up to 94% of the causes of chronic cough in prospective studies.   Other conditions have constituted no >6% of the causes in prospective studies These have included bronchogenic carcinoma, chronic interstitial pneumonia, sarcoidosis, left ventricular failure, ACEI-induced cough, and aspiration from a condition associated with pharyngeal dysfunction.    Chronic cough is often simultaneously caused by more than one condition. A single cause has been found from 38 to 82% of the time, multiple causes from 18 to 62%. Multiply caused cough has been the result of three diseases up to 42% of the time.       Based on hx and exam, this is most likely:  Classic Upper airway cough syndrome, so named because it's frequently impossible to sort out how much is  CR/sinusitis with freq throat clearing (which can be related to primary GERD)   vs  causing  secondary (" extra esophageal")  GERD from wide swings in gastric pressure that occur with throat clearing, often  promoting self use of mint and menthol lozenges that reduce the lower esophageal sphincter tone and exacerbate the problem further in a cyclical fashion.   These are the same pts (now being labeled as having "irritable larynx syndrome" by some cough centers) who not infrequently have a history of having failed to tolerate ace inhibitors,  dry powder inhalers or biphosphonates or report having atypical reflux symptoms that don't respond to standard doses of PPI , and are easily confused as  having aecopd or asthma flares by even experienced allergists/ pulmonologists.   The first step is to maximize acid suppression and eliminate cyclical coughin with neurontin but   regroup if the cough persists with course of steroids and elimination of cyclical cough x min of 3 days   See instructions for specific recommendations which were reviewed directly with the patient who was given a copy with highlighter outlining the key components.

## 2013-11-13 ENCOUNTER — Ambulatory Visit (INDEPENDENT_AMBULATORY_CARE_PROVIDER_SITE_OTHER): Payer: No Typology Code available for payment source | Admitting: Internal Medicine

## 2013-11-13 ENCOUNTER — Encounter: Payer: Self-pay | Admitting: Internal Medicine

## 2013-11-13 VITALS — BP 118/74 | HR 66 | Temp 98.3°F | Ht 65.5 in | Wt 128.0 lb

## 2013-11-13 DIAGNOSIS — R058 Other specified cough: Secondary | ICD-10-CM

## 2013-11-13 DIAGNOSIS — R059 Cough, unspecified: Secondary | ICD-10-CM

## 2013-11-13 DIAGNOSIS — R05 Cough: Secondary | ICD-10-CM

## 2013-11-13 MED ORDER — TRAMADOL HCL 50 MG PO TABS: ORAL_TABLET | ORAL | Status: DC

## 2013-11-13 MED ORDER — GABAPENTIN 300 MG PO CAPS: 300.0000 mg | ORAL_CAPSULE | Freq: Three times a day (TID) | ORAL | Status: DC

## 2013-11-13 MED ORDER — PREDNISONE 10 MG PO TABS: ORAL_TABLET | ORAL | Status: DC

## 2013-11-13 NOTE — Patient Instructions (Signed)
No mint at all  Of any type  Increase neurontin to 300 mg three times a day  Change omeprazole to 30 min before supper  Prednisone 10 mg take  4 each am x 2 days,   2 each am x 2 days,  1 each am x 2 days and stop   Take delsym two tsp every 12 hours and supplement if needed with  tramadol 50 mg up to 2 every 4 hours to suppress the urge to cough. Swallowing water or using ice chips/non mint and menthol containing candies (such as lifesavers or sugarless jolly ranchers) are also effective.  You should rest your voice and avoid activities that you know make you cough.  Once you have eliminated the cough for 3 straight days try reducing the tramadol first,  then the delsym as tolerated.   Please schedule a follow up office visit in 2 weeks, sooner if needed

## 2013-11-13 NOTE — Progress Notes (Signed)
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   History of Present Illness  11/02/2013 1st [facility] office visit/ Debra Cain  Chief Complaint  Patient presents with  . Pulmonary Consult    Self referral. Pt c/o cough x 5 yrs.   Cough is non prod, sometimes until the point of vomiting.  Worse while eating and is triggered by certain smells.   onset was insidious with lots of throat clearing, pattern has been persistent x 5 y  [facility]/ ent dx was acid reflux rx x years and no better. [facility] w/u also unhelpful, does not remember what dept but sent for speech therapy and failed to keep appt. Cough is dry more than wet, day >  Night, chokes when eats lunch, p smells like at work. Rarely does gets sob unless coughing maybe once a month with clogging, better p saba  eia in hs only soccer and cheerleading Allergy testing was neg on [address]  Started taking h1 and h2 can't really tell much yet  rec Pantoprazole (protonix) 40 mg   Take 30-60 min before first meal of the day and Pepcid 20 mg one bedtime until return to office and chlortrimeton 4 mg at bedtime GERD diet . Gabapentin 100 mg three times a day  Schedule an appt in 2 weeks at your convenience when we can do heavy cough suppression.     11/13/2013 f/u ov/Debra Cain re: chronic cough x 5y daily  Chief Complaint  Patient presents with  . Follow-up    Pt states that slightly improved. She was able to eat a meal yesterday w/o any cough.  No new co's today.       Kouffman Reflux v Neurogenic Cough Differentiator Reflux Comments  Do you awaken from a sound sleep coughing violently?                            With trouble breathing? x1 since ov   Do you have choking episodes when you cannot  Get enough air, gasping for air ?              Yes   Do you  usually cough when you lie down into  The bed, or when you just lie down to rest ?                          no   Do you usually cough after meals or eating?         definitely   Do you cough when (or after) you bend over?    Not now   GERD SCORE     Kouffman Reflux v Neurogenic Cough Differentiator Neurogenic   Do you more-or-less cough all day long? Clears throat less freq   Does change of temperature make you cough? Yes,heat   Does laughing or chuckling cause you to cough? yes   Do fumes (perfume, automobile fumes, burned  Toast, etc.,) cause you to cough ?      yes   Does speaking, singing, or talking on the phone cause you to cough   ?               yes  Neurogenic/Airway score      Also severe cough with mint toothpaste  No obvious other day to day or daytime variabilty or assoc sob or cp or chest tightness, subjective wheeze overt sinus or hb symptoms. No unusual exp hx or h/o childhood pna/ asthma or knowledge of premature birth.  Sleeping ok without nocturnal  or early am exacerbation  of respiratory  c/o's or need for noct saba. Also denies any obvious fluctuation of symptoms with weather or environmental changes or other aggravating or alleviating factors except as outlined above   Current Medications, Allergies, Complete Past Medical History, Past Surgical History, Family History, and Social History were reviewed in [employer] record.  ROS  The following are not active complaints unless bolded sore throat, dysphagia, dental problems, itching, sneezing,  nasal congestion or excess/ purulent secretions, ear ache,   fever, chills, sweats, unintended wt loss, pleuritic or exertional cp, hemoptysis,  orthopnea pnd or leg swelling, presyncope, palpitations, heartburn, abdominal pain, anorexia, nausea, vomiting, diarrhea  or change in bowel or urinary habits, change in stools or urine, dysuria,hematuria,  rash, arthralgias, visual complaints, headache, numbness  weakness or ataxia or problems with walking or coordination,  change in mood/affect or memory.              Objective:   Physical Exam  amb wf with less voice fatigue,  No throat clearing   11/13/2013         128  Wt Readings from Last 3 Encounters:  11/02/13 125 lb (56.7 kg)  10/19/13 126 lb (57.153 kg)  04/21/12 124 lb 9.6 oz (56.518 kg)      HEENT: nl dentition, turbinates, and orophanx. Nl external ear canals without cough reflex   NECK :  without JVD/Nodes/TM/ nl carotid upstrokes bilaterally   LUNGS: no acc muscle use, clear to A and P bilaterally with  cough on insp  maneuvers   CV:  RRR  no s3 or murmur or increase in P2, no edema   ABD:  soft and nontender with nl excursion in the supine position. No bruits or organomegaly, bowel sounds nl  MS:  warm without deformities, calf tenderness, cyanosis or clubbing  SKIN: warm and dry without lesions    NEURO:  alert, approp, no deficits    11/03/23 chest X-ray  wnl     Assessment & Plan:

## 2013-11-14 NOTE — Assessment & Plan Note (Signed)
-   neurontin 100 tid 11/02/2013 > improved but still a long way to go  The standardized cough guidelines published in Chest by Lissa Morales in 2006 are still the best available and consist of a multiple step process (up to 12!) , not a single office visit,  and are intended  to address this problem logically,  with an alogrithm dependent on response to empiric treatment at  each progressive step  to determine a specific diagnosis with  minimal addtional testing needed. Therefore if adherence is an issue or can't be accurately verified,  it's very unlikely the standard evaluation and treatment will be successful here.    Furthermore, response to therapy (other than acute cough suppression, which should only be used short term with avoidance of narcotic containing cough syrups if possible), can be a gradual process for which the patient may perceive immediate benefit.  Unlike going to an eye doctor where the best perscription is almost always the first one and is immediately effective, this is almost never the case in the management of chronic cough syndromes. Therefore the patient needs to commit up front to consistently adhere to recommendations  for up to 6 weeks of therapy directed at the likely underlying problem(s) before the response can be reasonably evaluated.   First step is completely eliminate cyclical coughing then regroup   See instructions for specific recommendations which were reviewed directly with the patient who was given a copy with highlighter outlining the key components.

## 2013-11-16 ENCOUNTER — Ambulatory Visit: Payer: No Typology Code available for payment source | Admitting: Internal Medicine

## 2013-11-19 ENCOUNTER — Telehealth: Payer: Self-pay | Admitting: Internal Medicine

## 2013-11-19 DIAGNOSIS — R05 Cough: Secondary | ICD-10-CM

## 2013-11-19 DIAGNOSIS — R058 Other specified cough: Secondary | ICD-10-CM

## 2013-11-19 NOTE — Telephone Encounter (Signed)
Called spoke with pt. She reports she has not been cough free x 5 days now.  She is due to go back to work on Tuesday. Pt reports she has been doing very weel. Pt wants to know which medications she can stop/decrease. Pt reports she can't evend rive a car now, not able to function properly. Please advise MW thanks

## 2013-11-19 NOTE — Telephone Encounter (Signed)
Pt aware of rec's per MW Expressed understanding. Nothing further needed.

## 2013-11-19 NOTE — Telephone Encounter (Signed)
Stop the ultram and should be able to drive   Only use the chlorpheniramine as needed for drainage as may cause drowsiness

## 2013-11-30 ENCOUNTER — Other Ambulatory Visit: Payer: Self-pay | Admitting: Internal Medicine

## 2013-11-30 ENCOUNTER — Encounter: Payer: Self-pay | Admitting: Internal Medicine

## 2013-11-30 ENCOUNTER — Ambulatory Visit (INDEPENDENT_AMBULATORY_CARE_PROVIDER_SITE_OTHER): Payer: No Typology Code available for payment source | Admitting: Internal Medicine

## 2013-11-30 VITALS — BP 124/76 | HR 74 | Ht 65.5 in | Wt 129.0 lb

## 2013-11-30 DIAGNOSIS — R05 Cough: Secondary | ICD-10-CM

## 2013-11-30 DIAGNOSIS — R058 Other specified cough: Secondary | ICD-10-CM

## 2013-11-30 DIAGNOSIS — R059 Cough, unspecified: Secondary | ICD-10-CM

## 2013-11-30 MED ORDER — TRAMADOL HCL 50 MG PO TABS: 50.0000 mg | ORAL_TABLET | Freq: Two times a day (BID) | ORAL | Status: DC

## 2013-11-30 NOTE — Patient Instructions (Addendum)
No change in medications for now but try to use less tramadol and if needed more chlortrimeton 4 mg take 2 at time and every 4 hours during the day for cough throat tickle, choking, drainage  Please schedule a follow up office visit in 6 weeks, call sooner if needed

## 2013-11-30 NOTE — Progress Notes (Signed)
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   History of Present Illness  11/02/2013 1st [facility] office visit/ Debra Cain  Chief Complaint  Patient presents with  . Pulmonary Consult    Self referral. Pt c/o cough x 5 yrs.   Cough is non prod, sometimes until the point of vomiting.  Worse while eating and is triggered by certain smells.   onset was insidious with lots of throat clearing, pattern has been persistent x 5 y  [facility]/ ent dx was acid reflux rx x years and no better. [facility] w/u also unhelpful, does not remember what dept but sent for speech therapy and failed to keep appt. Cough is dry more than wet, day >  Night, chokes when eats lunch, p smells like at work. Rarely does gets sob unless coughing maybe once a month with clogging, better p saba  eia in hs only soccer and cheerleading Allergy testing was neg on [address]  Started taking h1 and h2 can't really tell much yet  rec Pantoprazole (protonix) 40 mg   Take 30-60 min before first meal of the day and Pepcid 20 mg one bedtime until return to office and chlortrimeton 4 mg at bedtime GERD diet . Gabapentin 100 mg three times a day  Schedule an appt in 2 weeks at your convenience when we can do heavy cough suppression.     11/13/2013 f/u ov/Debra Cain re: chronic cough x 5y daily  Chief Complaint  Patient presents with  . Follow-up    Pt states that slightly improved. She was able to eat a meal yesterday w/o any cough.  No new co's today.       Kouffman Reflux v Neurogenic Cough Differentiator Reflux Comments  Do you awaken from a sound sleep coughing violently?                            With trouble breathing? x1 since ov   Do you have choking episodes when you cannot  Get enough air, gasping for air ?              Yes   Do you usually cough when you lie down into  The bed, or when you just lie down to rest ?                          no   Do you usually cough after meals or eating?         definitely   Do you cough when (or after) you bend over?    Not now   GERD SCORE     Kouffman Reflux v Neurogenic Cough Differentiator Neurogenic   Do you more-or-less cough all day long? Clears throat less freq   Does change of temperature make you cough? Yes,heat   Does laughing or chuckling cause you to cough? yes   Do fumes (perfume, automobile fumes, burned  Toast, etc.,) cause you to cough ?      yes   Does speaking, singing, or talking on the phone cause you to cough   ?               yes  Neurogenic/Airway score      Also severe cough with mint toothpaste  rec No mint at all  Of any type Increase neurontin to 300 mg three times a day  Change omeprazole to 30 min before supper Prednisone 10 mg take  4 each am x 2 days,   2 each am x 2 days,  1 each am x 2 days and stop  Take delsym two tsp every 12 hours and supplement if needed with  tramadol 50 mg up to 2 every 4 hours to suppress the urge to cough.   11/30/2013 f/u ov/Debra Cain re: still on neurontin 300 mg bid / could not tol the 300 tid dose but did help alot Chief Complaint  Patient presents with  . Follow-up    Nonprod cough is present but improved since last visit.   was 100% elimin x 5 days on tramadol and high dose neurontin then p reducing the tramadol started back with occ dry cough - no particular time cough flares but rarely at hs.  Not using 1st gen h1 x at hs. Using no more than 2 tramadol per day.  No obvious other day to day or daytime variabilty or assoc sob (if not coughing) or cp or chest tightness, subjective wheeze overt sinus or hb symptoms. No unusual exp hx or h/o childhood pna/ asthma or knowledge of premature birth.  Sleeping ok without nocturnal  or early am exacerbation  of respiratory  c/o's or need for noct saba. Also denies  any obvious fluctuation of symptoms with weather or environmental changes or other aggravating or alleviating factors except as outlined above   Current Medications, Allergies, Complete Past Medical History, Past Surgical History, Family History, and Social History were reviewed in [employer] record.  ROS  The following are not active complaints unless bolded sore throat, dysphagia, dental problems, itching, sneezing,  nasal congestion or excess/ purulent secretions, ear ache,   fever, chills, sweats, unintended wt loss, pleuritic or exertional cp, hemoptysis,  orthopnea pnd or leg swelling, presyncope, palpitations, heartburn, abdominal pain, anorexia, nausea, vomiting, diarrhea  or change in bowel or urinary habits, change in stools or urine, dysuria,hematuria,  rash, arthralgias, visual complaints, headache, numbness weakness or ataxia or problems with walking or coordination,  change in mood/affect or memory.              Objective:   Physical Exam  amb wf with less voice fatigue,  No throat clearing   11/13/2013         128  > 11/30/2013  129  Wt Readings from Last 3 Encounters:  11/02/13 125 lb (56.7 kg)  10/19/13 126 lb (57.153 kg)  04/21/12 124 lb 9.6 oz (56.518 kg)      HEENT: nl dentition, turbinates, and orophanx. Nl external ear canals without cough reflex   NECK :  without JVD/Nodes/TM/ nl carotid upstrokes bilaterally   LUNGS: no acc muscle use, clear to A and P bilaterally with  cough on insp  maneuvers   CV:  RRR  no s3 or murmur or increase in P2, no edema   ABD:  soft and nontender with nl excursion in the supine position. No bruits or organomegaly, bowel sounds nl  MS:  warm without deformities, calf tenderness, cyanosis or clubbing      11/03/23 chest X-ray  wnl     Assessment & Plan:

## 2013-11-30 NOTE — Assessment & Plan Note (Signed)
-   neurontin 100 tid 11/02/2013 >  Increase to 300 tid 11/13/2013 >  11/19/2013 called to report all better but then educed neurontin to 300 bid due to cns effects  Given the chronicity of the cough and now the minimal need for tramadol will declare victory and see if can increase h1 and taper off tramadol completely    Each maintenance medication was reviewed in detail including most importantly the difference between maintenance and as needed and under what circumstances the prns are to be used.  Please see instructions for details which were reviewed in writing and the patient given a copy.

## 2014-01-11 ENCOUNTER — Encounter: Payer: Self-pay | Admitting: Internal Medicine

## 2014-01-11 ENCOUNTER — Ambulatory Visit (INDEPENDENT_AMBULATORY_CARE_PROVIDER_SITE_OTHER): Payer: No Typology Code available for payment source | Admitting: Internal Medicine

## 2014-01-11 VITALS — BP 120/80 | HR 83 | Temp 98.3°F | Ht 65.0 in | Wt 128.8 lb

## 2014-01-11 DIAGNOSIS — R058 Other specified cough: Secondary | ICD-10-CM

## 2014-01-11 DIAGNOSIS — R05 Cough: Secondary | ICD-10-CM

## 2014-01-11 MED ORDER — BENZONATATE 200 MG PO CAPS: ORAL_CAPSULE | ORAL | Status: DC

## 2014-01-11 MED ORDER — PREDNISONE 10 MG PO TABS: ORAL_TABLET | ORAL | Status: DC

## 2014-01-11 NOTE — Patient Instructions (Signed)
Pantoprazole (protonix) 40 mg   Take 30-60 min before first meal of the day and Pepcid 20 mg one bedtime until return to office - this is the best way to tell whether stomach acid is contributing to your problem.   Prednisone 10 mg take  4 each am x 2 days,   2 each am x 2 days,  1 each am x 2 days and stop   For cough > delsym/tessilan as needed  GERD (REFLUX)  is an extremely common cause of respiratory symptoms, many times with no significant heartburn at all.    It can be treated with medication, but also with lifestyle changes including avoidance of late meals, excessive alcohol, smoking cessation, and avoid fatty foods, chocolate, peppermint, colas, red wine, and acidic juices such as orange juice.  NO MINT OR MENTHOL PRODUCTS SO NO COUGH DROPS  USE SUGARLESS CANDY INSTEAD (jolley ranchers or Stover's and life savers)  NO OIL BASED VITAMINS - use powdered substitutes.  Please schedule a follow up office visit in 6 weeks, call sooner if needed

## 2014-01-11 NOTE — Progress Notes (Signed)
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   History of Present Illness  11/02/2013 1st [facility] office visit/ Debra Cain  Chief Complaint  Patient presents with  . Pulmonary Consult    Self referral. Pt c/o cough x 5 yrs.   Cough is non prod, sometimes until the point of vomiting.  Worse while eating and is triggered by certain smells.   onset was insidious with lots of throat clearing, pattern has been persistent x 5 y  [facility] dx was acid reflux rx x years and no better. [facility] w/u also unhelpful, does not remember what dept but sent for speech therapy and failed to keep appt. Cough is dry more than wet, day >  Night, chokes when eats lunch, p smells like at work. Rarely does gets sob unless coughing maybe once a month with clogging, better p saba  eia in hs only soccer and cheerleading Allergy testing was neg on [address]  Started taking h1 and h2 can't really tell much yet  rec Pantoprazole (protonix) 40 mg   Take 30-60 min before first meal of the day and Pepcid 20 mg one bedtime until return to office and chlortrimeton 4 mg at bedtime GERD diet . Gabapentin 100 mg three times a day  Schedule an appt in 2 weeks at your convenience when we can do heavy cough suppression.     11/13/2013 f/u ov/Debra Cain re: chronic cough x 5y daily  Chief Complaint  Patient presents with  . Follow-up    Pt states that slightly improved. She was able to eat a meal yesterday w/o any cough.  No new co's today.       Debra Cain Reflux Comments  Do you awaken from a sound sleep coughing violently?                            With trouble breathing? x1 since ov   Do you have choking episodes when you cannot  Get enough air, gasping for air ?              Yes   Do you usually cough when you lie down into  The bed, or when you just lie down to rest ?                          no   Do you usually cough after meals or eating?         definitely   Do you cough when (or after) you bend over?    Not now   GERD SCORE     Debra Cain Neurogenic   Do you more-or-less cough all day long? Clears throat less freq   Does change of temperature make you cough? Yes,heat   Does laughing or chuckling cause you to cough? yes   Do fumes (perfume, automobile fumes, burned  Toast, etc.,) cause you to cough ?      yes   Does speaking, singing, or talking on the phone cause you to cough   ?               yes  Neurogenic/Airway score      Also severe cough with mint toothpaste  rec No mint at all  Of any type Increase neurontin to 300 mg three times a day  Change omeprazole to 30 min before supper Prednisone 10 mg take  4 each am x 2 days,   2 each am x 2 days,  1 each am x 2 days and stop  Take delsym two tsp every 12 hours and supplement if needed with  tramadol 50 mg up to 2 every 4 hours to suppress the urge to cough.   11/30/2013 f/u ov/Debra Cain re: still on neurontin 300 mg bid / could not tol the 300 tid dose but did help alot Chief Complaint  Patient presents with  . Follow-up    Nonprod cough is present but improved since last visit.   was 100% elimin x 5 days on tramadol and high dose neurontin then p reducing the tramadol started back with occ dry cough - no particular time cough flares but rarely at hs.  Not using 1st gen h1 x at hs. Using no more than 2 tramadol per day. rec No change in medications for now but try to use less tramadol and if needed more chlortrimeton 4 mg take 2 at time and every 4 hours during the day for cough throat tickle, choking, drainage   01/11/2014 f/u ov/Debra Cain re: chronic cough on chlortrimeton / neurontin 300 bid omeprazole q am / pepcid Chief Complaint  Patient presents with  .  Follow-up    Pt states that cough have been worse since she developed a cold approx 3 days ago. Cough is non prod.     cough was better and only rare choking / vomiting with new  baseline  =   first thing in am but does not wake her prematurely and no longer using any tramadol or delsym,  Then acutely ill x 3 days much worse than baseline in setting of apparent uri with nasal congestion/ scratchy throat but still no excess or purulent sputum, sob, cp or fever      No obvious other day to day or daytime variabilty or assoc sob (if not coughing) or cp or chest tightness, subjective wheeze overt sinus or hb symptoms. No unusual exp hx or h/o childhood pna/ asthma or knowledge of premature birth.  Sleeping ok without nocturnal  or early am exacerbation  of respiratory  c/o's or need for noct saba. Also denies any obvious fluctuation of symptoms with weather or environmental changes or other aggravating or alleviating factors except as outlined above   Current Medications, Allergies, Complete Past Medical History, Past Surgical History, Family History, and Social History were reviewed in [employer] record.  ROS  The following are not active complaints unless bolded sore throat, dysphagia, dental problems, itching, sneezing,  nasal congestion or excess/ purulent secretions, ear ache,   fever, chills, sweats, unintended wt loss, pleuritic or exertional cp, hemoptysis,  orthopnea pnd or leg swelling, presyncope, palpitations, heartburn, abdominal pain, anorexia, nausea, vomiting, diarrhea  or change in bowel or urinary habits, change in stools or urine, dysuria,hematuria,  rash, arthralgias, visual complaints, headache, numbness weakness or ataxia or problems with walking or coordination,  change in mood/affect or memory.              Objective:   Physical Exam  amb wf with   freq throat clearing   11/13/2013         128  > 11/30/2013  129 > 01/11/2014  129  Wt Readings from  Last 3 Encounters:  11/02/13 125 lb (56.7 kg)  10/19/13 126 lb (57.153 kg)  04/21/12 124 lb 9.6 oz (56.518 kg)      HEENT: nl dentition, turbinates, and orophanx which is pristine. Nl external ear canals without cough reflex   NECK :  without JVD/Nodes/TM/ nl carotid upstrokes bilaterally   LUNGS: no acc muscle use, clear to A and P bilaterally with  cough on insp  maneuvers   CV:  RRR  no s3 or murmur or increase in P2, no edema   ABD:  soft and nontender with nl excursion in the supine position. No bruits or organomegaly, bowel sounds nl  MS:  warm without deformities, calf tenderness, cyanosis or clubbing      11/03/23 chest X-ray  wnl     Assessment & Plan:

## 2014-01-11 NOTE — Assessment & Plan Note (Signed)
-   neurontin 100 tid 11/02/2013 >  Increase to 300 tid 11/13/2013 >  11/19/2013 called to report all better but 11/30/2013 reduced neurontin to 300 bid due to cns effects  Explained natural history of uri and why it's necessary in patients at risk to treat GERD aggressively  at least  short term   to reduce risk of evolving cyclical cough initially  triggered by epithelial injury and a heightened sensitivty to the effects of any upper airway irritants,  most importantly acid - related.  That is, the more sensitive the epithelium damaged for virus, the more the cough, the more the secondary reflux (especially in those prone to reflux) the more the irritation of the sensitive mucosa and so on in a cyclical pattern.   For now add back tessilon/ delsym and 6 days of prednisone but not change in maint rx for another 6 weeks  See instructions for specific recommendations which were reviewed directly with the patient who was given a copy with highlighter outlining the key components.

## 2014-01-26 ENCOUNTER — Other Ambulatory Visit: Payer: Self-pay | Admitting: Internal Medicine

## 2014-02-22 ENCOUNTER — Ambulatory Visit: Payer: No Typology Code available for payment source | Admitting: Internal Medicine

## 2014-03-01 ENCOUNTER — Other Ambulatory Visit: Payer: Self-pay | Admitting: Internal Medicine

## 2014-03-01 ENCOUNTER — Ambulatory Visit (INDEPENDENT_AMBULATORY_CARE_PROVIDER_SITE_OTHER): Payer: No Typology Code available for payment source | Admitting: Internal Medicine

## 2014-03-01 ENCOUNTER — Encounter: Payer: Self-pay | Admitting: Internal Medicine

## 2014-03-01 VITALS — BP 132/80 | HR 76 | Ht 65.5 in | Wt 132.0 lb

## 2014-03-01 DIAGNOSIS — J45991 Cough variant asthma: Secondary | ICD-10-CM

## 2014-03-01 DIAGNOSIS — R058 Other specified cough: Secondary | ICD-10-CM

## 2014-03-01 DIAGNOSIS — R05 Cough: Secondary | ICD-10-CM

## 2014-03-01 MED ORDER — MONTELUKAST SODIUM 10 MG PO TABS: 10.0000 mg | ORAL_TABLET | Freq: Every day | ORAL | Status: DC

## 2014-03-01 NOTE — Assessment & Plan Note (Signed)
Her h/o ? eia and improvement on saba strongly suggest cough variant asthma though the absence of noct and early am flares is against  rec trial of singulair each pm and then in 2 weeks while still on max gerd rx proceed with MCT     Each maintenance medication was reviewed in detail including most importantly the difference between maintenance and as needed and under what circumstances the prns are to be used.  Please see instructions for details which were reviewed in writing and the patient given a copy.

## 2014-03-01 NOTE — Assessment & Plan Note (Signed)
-   neurontin 100 tid 11/02/2013 >  Increase to 300 tid 11/13/2013 >  11/19/2013 called to report all better but 11/30/2013 reduced neurontin to 300 bid due to cns effect  Cannot tolerate fully therapeutic levels of neurontin but also may have a component of cough variant asthma (see sep a/p)  rec no change in recs

## 2014-03-01 NOTE — Progress Notes (Signed)
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 Does have h/o ?EIA ? Fixed cough/ difficulty breathing always using with clogging   History of Present Illness  11/02/2013 1st [facility] office visit/ [facility]  Chief Complaint  Patient presents with  . Pulmonary Consult    Self referral. Pt c/o cough x 5 yrs.   Cough is non prod, sometimes until the point of vomiting.  Worse while eating and is triggered by certain smells.   onset was insidious with lots of throat clearing, pattern has been persistent x 5 y  [facility]/ ent dx was acid reflux rx x years and no better. [facility] w/u also unhelpful, does not remember what dept but sent for speech therapy and failed to keep appt. Cough is dry more than wet, day >  Night, chokes when eats lunch, p smells like at work. Rarely does gets sob unless coughing maybe once a month with clogging, better p saba  eia in hs only soccer and cheerleading Allergy testing was neg on [address]  Started taking h1 and h2 can't really tell much yet  rec Pantoprazole (protonix) 40 mg   Take 30-60 min before first meal of the day and Pepcid 20 mg one bedtime until return to office and chlortrimeton 4 mg at bedtime GERD diet . Gabapentin 100 mg three times a day  Schedule an appt in 2 weeks at your convenience when we can do heavy cough suppression.     11/13/2013 f/u ov/[facility] re: chronic cough x 5y daily  Chief Complaint  Patient presents with  . Follow-up    Pt states that slightly improved. She was able to eat a meal yesterday w/o any cough.  No new co's today.       Kouffman Reflux v Neurogenic Cough Differentiator Reflux Comments  Do you awaken from a sound sleep coughing violently?                            With trouble breathing? x1 since ov   Do you have  choking episodes when you cannot  Get enough air, gasping for air ?              Yes   Do you usually cough when you lie down into  The bed, or when you just lie down to rest ?                          no   Do you usually cough after meals or eating?         definitely   Do you cough when (or after) you bend over?    Not now   GERD SCORE     Kouffman Reflux v Neurogenic Cough Differentiator Neurogenic   Do you more-or-less cough all day long? Clears throat less freq   Does change of temperature make you cough? Yes,heat   Does laughing or chuckling cause you to cough? yes   Do fumes (perfume, automobile fumes, burned  Toast, etc.,) cause you to cough ?      yes   Does speaking, singing, or talking on the phone cause you to cough   ?  yes   Neurogenic/Airway score      Also severe cough with mint toothpaste  rec No mint at all  Of any type Increase neurontin to 300 mg three times a day  Change omeprazole to 30 min before supper Prednisone 10 mg take  4 each am x 2 days,   2 each am x 2 days,  1 each am x 2 days and stop  Take delsym two tsp every 12 hours and supplement if needed with  tramadol 50 mg up to 2 every 4 hours to suppress the urge to cough.   11/30/2013 f/u ov/[facility] re: still on neurontin 300 mg bid / could not tol the 300 tid dose but did help alot Chief Complaint  Patient presents with  . Follow-up    Nonprod cough is present but improved since last visit.   was 100% elimin x 5 days on tramadol and high dose neurontin then p reducing the tramadol started back with occ dry cough - no particular time cough flares but rarely at hs.  Not using 1st gen h1 x at hs. Using no more than 2 tramadol per day. rec No change in medications for now but try to use less tramadol and if needed more chlortrimeton 4 mg take 2 at time and every 4 hours during the day for cough throat tickle, choking, drainage   01/11/2014 f/u ov/[facility] re: chronic cough on chlortrimeton / neurontin  300 bid omeprazole q am / pepcid Chief Complaint  Patient presents with  . Follow-up    Pt states that cough have been worse since she developed a cold approx 3 days ago. Cough is non prod.     cough was better and only rare choking / vomiting with new  baseline  =   first thing in am but does not wake her prematurely and no longer using any tramadol or delsym,  Then acutely ill x 3 days much worse than baseline in setting of apparent uri with nasal congestion/ scratchy throat but still no excess or purulent sputum, sob, cp or fever   rec Pantoprazole (protonix) 40 mg   Take 30-60 min before first meal of the day and Pepcid 20 mg one bedtime until return to office - this is the best way to tell whether stomach acid is contributing to your problem.  Prednisone 10 mg take  4 each am x 2 days,   2 each am x 2 days,  1 each am x 2 days and stop  For cough > delsym/tessilon as needed GERD diet   03/01/2014 f/u ov/[facility]ert re: cough x 10 y on neurontin 300 1-2 x daiy Chief Complaint  Patient presents with  . Follow-up    Pt states that her cough is unchnaged. No new co's today.    Cough is sporadic / better while sleeping, no more gag and vomit - " better than it's been in years" but still present, no longer using tramadol  Tessalon not really helping though nor is delsym.     No obvious other day to day or daytime variabilty or assoc sob (if not coughing) or cp or chest tightness, subjective wheeze overt sinus or hb symptoms. No unusual exp hx or h/o childhood pna/ asthma or knowledge of premature birth.  Sleeping ok without nocturnal  or early am exacerbation  of respiratory  c/o's or need for noct saba. Also denies any obvious fluctuation of symptoms with weather or environmental changes or other aggravating or alleviating factors  except as outlined above   Current Medications, Allergies, Complete Past Medical History, Past Surgical History, Family History, and Social History were reviewed in  [employer] record.  ROS  The following are not active complaints unless bolded sore throat, dysphagia, dental problems, itching, sneezing,  nasal congestion or excess/ purulent secretions, ear ache,   fever, chills, sweats, unintended wt loss, pleuritic or exertional cp, hemoptysis,  orthopnea pnd or leg swelling, presyncope, palpitations, heartburn, abdominal pain, anorexia, nausea, vomiting, diarrhea  or change in bowel or urinary habits, change in stools or urine, dysuria,hematuria,  rash, arthralgias, visual complaints, headache, numbness weakness or ataxia or problems with walking or coordination,  change in mood/affect or memory.              Objective:   Physical Exam  amb wf with   freq throat clearing   11/13/2013         128  > 11/30/2013  129 > 01/11/2014  129 > 03/01/2014 132 Wt Readings from Last 3 Encounters:  11/02/13 125 lb (56.7 kg)  10/19/13 126 lb (57.153 kg)  04/21/12 124 lb 9.6 oz (56.518 kg)      HEENT: nl dentition, turbinates, and orophanx which is pristine. Nl external ear canals without cough reflex   NECK :  without JVD/Nodes/TM/ nl carotid upstrokes bilaterally   LUNGS: no acc muscle use, clear to A and P bilaterally with  cough on insp  maneuvers   CV:  RRR  no s3 or murmur or increase in P2, no edema   ABD:  soft and nontender with nl excursion in the supine position. No bruits or organomegaly, bowel sounds nl  MS:  warm without deformities, calf tenderness, cyanosis or clubbing      11/03/23 chest X-ray  wnl     Assessment & Plan:

## 2014-03-01 NOTE — Patient Instructions (Addendum)
Please see patient coordinator before you leave today  to schedule methacholine challenge  In the meantime rec a trial of singulair (montelukast) 10 mg one each pm pending the results of the test   Please schedule a follow up office visit in 6 weeks, call sooner if needed

## 2014-03-15 ENCOUNTER — Encounter (HOSPITAL_COMMUNITY): Payer: No Typology Code available for payment source

## 2014-03-24 ENCOUNTER — Telehealth: Payer: Self-pay | Admitting: Internal Medicine

## 2014-03-24 MED ORDER — AZITHROMYCIN 250 MG PO TABS: 250.0000 mg | ORAL_TABLET | ORAL | Status: DC

## 2014-03-24 MED ORDER — TRAMADOL HCL 50 MG PO TABS: 50.0000 mg | ORAL_TABLET | Freq: Four times a day (QID) | ORAL | Status: DC | PRN

## 2014-03-24 NOTE — Telephone Encounter (Signed)
I called spoke with pt. She reports we did not get the full story earlier when she called.  She reports her cough did improve w/ singulair and had been doing great. Her spouse has been sick and now she believes she has whatever he had. Reports she is having PND now which is causing her to cough (worse at night). She has tried delsym (does nothing for her), dayquil and nyquil. Please advise Dr. Melvyn Novas thanks  Allergies  Allergen Reactions  . Augmentin [Amoxicillin-Pot Clavulanate]     Per patient  . Ciprofloxacin     Per patient  . Levaquin [Levofloxacin]     Per patient  . Macrobid [Nitrofurantoin Macrocrystal]     Per patient  . Minocycline     Per patient  . Saline Bacteriostatic     Per patient Panmist

## 2014-03-24 NOTE — Telephone Encounter (Signed)
That would indicate the singulair is not the answer so will need to return with all meds in hand to regroup  In meantime delsym is best cough med I can rec

## 2014-03-24 NOTE — Telephone Encounter (Signed)
zpak Take delsym two tsp every 12 hours and supplement if needed with  tramadol 50 mg up to 2 every 4 hours to suppress the urge to cough. Swallowing water or using ice chips/non mint and menthol containing candies (such as lifesavers or sugarless jolly ranchers) are also effective.  You should rest your voice and avoid activities that you know make you cough.   Rx #40 no refills

## 2014-03-24 NOTE — Telephone Encounter (Signed)
Pt c/o nonprod cough, sinus congestion, pnd X2 days.  No sob, no fever.  Is requesting recs, suggested a cough syrup or anything over the counter that may help.    Dr Melvyn Novas please advise.  Thanks!

## 2014-03-24 NOTE — Telephone Encounter (Signed)
Pt aware of rec's per MW--ZPAK and Tramadol called into pharmacy. Nothing further needed.

## 2014-03-24 NOTE — Telephone Encounter (Signed)
Pt aware of rec's per MW.  Pt refused to schedule at this time-- plans to call back at a later date to schedule OV. Nothing further needed.

## 2014-03-25 ENCOUNTER — Other Ambulatory Visit: Payer: Self-pay | Admitting: Internal Medicine

## 2014-03-30 ENCOUNTER — Other Ambulatory Visit: Payer: Self-pay | Admitting: Internal Medicine

## 2014-03-30 NOTE — Telephone Encounter (Signed)
MW, this was still on her med list, but looks like she is already taking the tramadol and delsym is this okay to refill? Please advise thanks!

## 2014-04-12 ENCOUNTER — Ambulatory Visit (INDEPENDENT_AMBULATORY_CARE_PROVIDER_SITE_OTHER): Payer: No Typology Code available for payment source | Admitting: Internal Medicine

## 2014-04-12 ENCOUNTER — Encounter: Payer: Self-pay | Admitting: Internal Medicine

## 2014-04-12 VITALS — BP 102/78 | HR 83 | Ht 64.5 in | Wt 131.0 lb

## 2014-04-12 DIAGNOSIS — J45991 Cough variant asthma: Secondary | ICD-10-CM

## 2014-04-12 DIAGNOSIS — R05 Cough: Secondary | ICD-10-CM

## 2014-04-12 DIAGNOSIS — R058 Other specified cough: Secondary | ICD-10-CM

## 2014-04-12 MED ORDER — GABAPENTIN 100 MG PO CAPS: 100.0000 mg | ORAL_CAPSULE | Freq: Three times a day (TID) | ORAL | Status: DC

## 2014-04-12 NOTE — Progress Notes (Signed)
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 Does have h/o ?EIA ? Fixed cough/ difficulty breathing always using with clogging   History of Present Illness  11/02/2013 1st [facility] office visit/ Debra Cain  Chief Complaint  Patient presents with  . Pulmonary Consult    Self referral. Pt c/o cough x 5 yrs.   Cough is non prod, sometimes until the point of vomiting.  Worse while eating and is triggered by certain smells.   onset was insidious with lots of throat clearing, pattern has been persistent x 5 y  [facility]/ ent dx was acid reflux rx x years and no better. [facility] w/u also unhelpful, does not remember what dept but sent for speech therapy and failed to keep appt. Cough is dry more than wet, day >  Night, chokes when eats lunch, p smells like at work. Rarely does gets sob unless coughing maybe once a month with clogging, better p saba  eia in hs only soccer and cheerleading Allergy testing was neg on [address]  Started taking h1 and h2 can't really tell much yet  rec Pantoprazole (protonix) 40 mg   Take 30-60 min before first meal of the day and Pepcid 20 mg one bedtime until return to office and chlortrimeton 4 mg at bedtime GERD diet . Gabapentin 100 mg three times a day  Schedule an appt in 2 weeks at your convenience when we can do heavy cough suppression.     11/13/2013 f/u ov/Debra Cain re: chronic cough x 5y daily  Chief Complaint  Patient presents with  . Follow-up    Pt states that slightly improved. She was able to eat a meal yesterday w/o any cough.  No new co's today.       Kouffman Reflux v Neurogenic Cough Differentiator Reflux Comments  Do you awaken from a sound sleep coughing violently?                            With trouble breathing? x1 since ov   Do you have  choking episodes when you cannot  Get enough air, gasping for air ?              Yes   Do you usually cough when you lie down into  The bed, or when you just lie down to rest ?                          no   Do you usually cough after meals or eating?         definitely   Do you cough when (or after) you bend over?    Not now   GERD SCORE     Kouffman Reflux v Neurogenic Cough Differentiator Neurogenic   Do you more-or-less cough all day long? Clears throat less freq   Does change of temperature make you cough? Yes,heat   Does laughing or chuckling cause you to cough? yes   Do fumes (perfume, automobile fumes, burned  Toast, etc.,) cause you to cough ?      yes   Does speaking, singing, or talking on the phone cause you to cough   ?  yes   Neurogenic/Airway score      Also severe cough with mint toothpaste  rec No mint at all  Of any type Increase neurontin to 300 mg three times a day  Change omeprazole to 30 min before supper Prednisone 10 mg take  4 each am x 2 days,   2 each am x 2 days,  1 each am x 2 days and stop  Take delsym two tsp every 12 hours and supplement if needed with  tramadol 50 mg up to 2 every 4 hours to suppress the urge to cough.   11/30/2013 f/u ov/Debra Cain re: still on neurontin 300 mg bid / could not tol the 300 tid dose but did help alot Chief Complaint  Patient presents with  . Follow-up    Nonprod cough is present but improved since last visit.   was 100% elimin x 5 days on tramadol and high dose neurontin then p reducing the tramadol started back with occ dry cough - no particular time cough flares but rarely at hs.  Not using 1st gen h1 x at hs. Using no more than 2 tramadol per day. rec No change in medications for now but try to use less tramadol and if needed more chlortrimeton 4 mg take 2 at time and every 4 hours during the day for cough throat tickle, choking, drainage   01/11/2014 f/u ov/Debra Cain re: chronic cough on chlortrimeton / neurontin  300 bid omeprazole q am / pepcid Chief Complaint  Patient presents with  . Follow-up    Pt states that cough have been worse since she developed a cold approx 3 days ago. Cough is non prod.     cough was better and only rare choking / vomiting with new  baseline  =   first thing in am but does not wake her prematurely and no longer using any tramadol or delsym,  Then acutely ill x 3 days much worse than baseline in setting of apparent uri with nasal congestion/ scratchy throat but still no excess or purulent sputum, sob, cp or fever   rec Pantoprazole (protonix) 40 mg   Take 30-60 min before first meal of the day and Pepcid 20 mg one bedtime until return to office - this is the best way to tell whether stomach acid is contributing to your problem.  Prednisone 10 mg take  4 each am x 2 days,   2 each am x 2 days,  1 each am x 2 days and stop  For cough > delsym/tessilon as needed GERD diet   03/01/2014 f/u ov/Debra Cain re: cough x 10 y on neurontin 300 1-2 x daiy Chief Complaint  Patient presents with  . Follow-up    Pt states that her cough is unchnaged. No new co's today.    Cough is sporadic / better while sleeping, no more gag and vomit - " better than it's been in years" but still present, no longer using tramadol  Tessalon not really helping though nor is delsym.  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 Follow-up    pt has non  productive cough, pt denies wheezining,chest tightness, sob.   cough is completely sporadic, unpredictable/ much worse with uri now resolved but coughing fits at least once daily, never noct    No obvious  day to day  or daytime variabilty or assoc sob (if not coughing) or cp or chest tightness, subjective wheeze overt sinus or hb symptoms. No unusual exp hx or h/o childhood pna/ asthma or knowledge of premature birth.  Sleeping ok without nocturnal  or early am exacerbation  of respiratory  c/o's or need for noct saba. Also denies any obvious fluctuation of symptoms with weather or environmental changes or other aggravating or alleviating factors except as outlined above   Current Medications, Allergies, Complete Past Medical History, Past Surgical History, Family History, and Social History were reviewed in [employer] record.  ROS  The following are not active complaints unless bolded sore throat, dysphagia, dental problems, itching, sneezing,  nasal congestion or excess/ purulent secretions, ear ache,   fever, chills, sweats, unintended wt loss, pleuritic or exertional cp, hemoptysis,  orthopnea pnd or leg swelling, presyncope, palpitations, heartburn, abdominal pain, anorexia, nausea, vomiting, diarrhea  or change in bowel or urinary habits, change in stools or urine, dysuria,hematuria,  rash, arthralgias, visual complaints, headache, numbness weakness or ataxia or problems with walking or coordination,  change in mood/affect or memory.              Objective:   Physical Exam  amb wf nad   11/13/2013         128  > 11/30/2013  129 > 01/11/2014  129 > 03/01/2014 132> 04/12/2014  131  Wt Readings from Last 3 Encounters:  11/02/13 125 lb (56.7 kg)  10/19/13 126 lb (57.153 kg)  04/21/12 124 lb 9.6 oz (56.518 kg)      HEENT: nl dentition, turbinates, and orophanx which remains  pristine. Nl external ear canals without cough reflex   NECK :  without JVD/Nodes/TM/ nl carotid upstrokes bilaterally   LUNGS: no acc muscle use, clear to A and P bilaterally with  cough on insp  maneuvers   CV:  RRR  no s3 or murmur or increase in P2, no edema   ABD:  soft and nontender with nl  excursion in the supine position. No bruits or organomegaly, bowel sounds nl  MS:  warm without deformities, calf tenderness, cyanosis or clubbing      11/03/23 chest X-ray  wnl     Assessment & Plan:

## 2014-04-12 NOTE — Patient Instructions (Signed)
Add neurontin 100 mg up to 300 mg three times if you need = 900 mg maximum per day  Please schedule a follow up visit in 3 months but call sooner if needed

## 2014-04-13 ENCOUNTER — Encounter: Payer: Self-pay | Admitting: Internal Medicine

## 2014-04-13 NOTE — Assessment & Plan Note (Signed)
Cough much better but not irradicated and the present pattern which is daytime and completely sporadic is strongly if favor of uacs and against cough variant asthma  rec try to edge up the daytime use of neurontin by supplementing the noct 300 mg dose with as much as she can tol of the 100 dose in daytime with a max target goal of 900 mg daily and try to find a dose that completely eliminates the cough s cns side effects    Each maintenance medication was reviewed in detail including most importantly the difference between maintenance and as needed and under what circumstances the prns are to be used.  Please see instructions for details which were reviewed in writing and the patient given a copy.

## 2014-04-13 NOTE — Assessment & Plan Note (Signed)
The allergic/ asthmatic component appears to be adequately addressed by the use of singulair so no change in rx needed

## 2014-04-26 ENCOUNTER — Other Ambulatory Visit: Payer: Self-pay | Admitting: Internal Medicine

## 2014-06-08 DIAGNOSIS — H3320 Serous retinal detachment, unspecified eye: Secondary | ICD-10-CM

## 2014-06-08 HISTORY — DX: Serous retinal detachment, unspecified eye: H33.20

## 2014-06-16 ENCOUNTER — Other Ambulatory Visit: Payer: Self-pay | Admitting: Ophthalmology

## 2014-06-16 ENCOUNTER — Encounter (HOSPITAL_COMMUNITY): Payer: Self-pay | Admitting: *Deleted

## 2014-06-17 ENCOUNTER — Ambulatory Visit (HOSPITAL_COMMUNITY): Payer: No Typology Code available for payment source | Admitting: Certified Registered"

## 2014-06-17 ENCOUNTER — Ambulatory Visit (HOSPITAL_COMMUNITY)
Admission: RE | Admit: 2014-06-17 | Discharge: 2014-06-17 | Disposition: A | Discharge status: Home / Self Care | Payer: No Typology Code available for payment source | Source: Ambulatory Visit | Attending: Ophthalmology | Admitting: Ophthalmology

## 2014-06-17 ENCOUNTER — Encounter (HOSPITAL_COMMUNITY)
Admission: RE | Disposition: A | Discharge status: Home / Self Care | Payer: Self-pay | Source: Ambulatory Visit | Attending: Ophthalmology

## 2014-06-17 ENCOUNTER — Encounter (HOSPITAL_COMMUNITY): Payer: Self-pay | Admitting: *Deleted

## 2014-06-17 DIAGNOSIS — Z79899 Other long term (current) drug therapy: Secondary | ICD-10-CM | POA: Diagnosis not present

## 2014-06-17 DIAGNOSIS — F419 Anxiety disorder, unspecified: Secondary | ICD-10-CM | POA: Insufficient documentation

## 2014-06-17 DIAGNOSIS — H33022 Retinal detachment with multiple breaks, left eye: Secondary | ICD-10-CM

## 2014-06-17 DIAGNOSIS — K219 Gastro-esophageal reflux disease without esophagitis: Secondary | ICD-10-CM | POA: Insufficient documentation

## 2014-06-17 DIAGNOSIS — Z881 Allergy status to other antibiotic agents status: Secondary | ICD-10-CM | POA: Insufficient documentation

## 2014-06-17 DIAGNOSIS — H3322 Serous retinal detachment, left eye: Secondary | ICD-10-CM | POA: Diagnosis present

## 2014-06-17 HISTORY — PX: VITRECTOMY 25 GAUGE WITH SCLERAL BUCKLE: SHX6183

## 2014-06-17 HISTORY — PX: GAS/FLUID EXCHANGE: SHX5334

## 2014-06-17 HISTORY — DX: Adverse effect of unspecified anesthetic, initial encounter: T41.45XA

## 2014-06-17 HISTORY — DX: Other complications of anesthesia, initial encounter: T88.59XA

## 2014-06-17 HISTORY — DX: Serous retinal detachment, unspecified eye: H33.20

## 2014-06-17 HISTORY — DX: Gastro-esophageal reflux disease without esophagitis: K21.9

## 2014-06-17 LAB — CBC
HCT: 42.6 % (ref 36.0–46.0)
Hemoglobin: 14.7 g/dL (ref 12.0–15.0)
MCH: 27.9 pg (ref 26.0–34.0)
MCHC: 34.5 g/dL (ref 30.0–36.0)
MCV: 80.8 fL (ref 78.0–100.0)
Platelets: 369 10*3/uL (ref 150–400)
RBC: 5.27 MIL/uL — ABNORMAL HIGH (ref 3.87–5.11)
RDW: 12 % (ref 11.5–15.5)
WBC: 6.9 10*3/uL (ref 4.0–10.5)

## 2014-06-17 LAB — HCG, SERUM, QUALITATIVE: Preg, Serum: NEGATIVE

## 2014-06-17 SURGERY — VITRECTOMY, USING 25-GAUGE INSTRUMENTS, WITH SCLERAL BUCKLING
Anesthesia: General | Site: Eye | Laterality: Left

## 2014-06-17 MED ORDER — ATROPINE SULFATE 1 % OP SOLN
1.0000 [drp] | OPHTHALMIC | Status: AC
  Administered 2014-06-17 (×3): 1 [drp] via OPHTHALMIC
  Filled 2014-06-17: qty 5

## 2014-06-17 MED ORDER — OXYCODONE-ACETAMINOPHEN 5-325 MG PO TABS: 1.0000 | ORAL_TABLET | ORAL | Status: DC | PRN

## 2014-06-17 MED ORDER — VANCOMYCIN SUBCONJUNCTIVAL INJECTION 25 MG/0.5 ML
25.0000 mg | INTRAOCULAR | Status: DC
  Filled 2014-06-17: qty 0.5

## 2014-06-17 MED ORDER — GLYCOPYRROLATE 0.2 MG/ML IJ SOLN
INTRAMUSCULAR | Status: DC | PRN
  Administered 2014-06-17: 0.6 mg via INTRAVENOUS

## 2014-06-17 MED ORDER — DEXAMETHASONE SODIUM PHOSPHATE 10 MG/ML IJ SOLN
INTRAMUSCULAR | Status: DC | PRN
  Administered 2014-06-17: 10 mg

## 2014-06-17 MED ORDER — BSS PLUS IO SOLN
INTRAOCULAR | Status: AC
  Filled 2014-06-17: qty 500

## 2014-06-17 MED ORDER — HYPROMELLOSE (GONIOSCOPIC) 2.5 % OP SOLN
OPHTHALMIC | Status: AC
  Filled 2014-06-17: qty 15

## 2014-06-17 MED ORDER — BUPIVACAINE HCL (PF) 0.75 % IJ SOLN
INTRAMUSCULAR | Status: AC
  Filled 2014-06-17: qty 10

## 2014-06-17 MED ORDER — FENTANYL CITRATE 0.05 MG/ML IJ SOLN: 25.0000 ug | INTRAMUSCULAR | Status: DC | PRN

## 2014-06-17 MED ORDER — PROMETHAZINE HCL 25 MG/ML IJ SOLN
INTRAMUSCULAR | Status: AC
  Filled 2014-06-17: qty 1

## 2014-06-17 MED ORDER — PHENYLEPHRINE HCL 2.5 % OP SOLN
1.0000 [drp] | OPHTHALMIC | Status: AC
Start: 2014-06-17 — End: 2014-06-17
  Administered 2014-06-17 (×3): 1 [drp] via OPHTHALMIC
  Filled 2014-06-17: qty 2

## 2014-06-17 MED ORDER — HYPROMELLOSE (GONIOSCOPIC) 2.5 % OP SOLN
OPHTHALMIC | Status: DC | PRN
  Administered 2014-06-17: 1 [drp] via OPHTHALMIC

## 2014-06-17 MED ORDER — EPINEPHRINE HCL 1 MG/ML IJ SOLN
INTRAMUSCULAR | Status: DC | PRN
  Administered 2014-06-17: .3 mL

## 2014-06-17 MED ORDER — 0.9 % SODIUM CHLORIDE (POUR BTL) OPTIME
TOPICAL | Status: DC | PRN
  Administered 2014-06-17: 1000 mL

## 2014-06-17 MED ORDER — LIDOCAINE HCL 2 % IJ SOLN
INTRAMUSCULAR | Status: DC | PRN
  Administered 2014-06-17: 4 mL

## 2014-06-17 MED ORDER — BSS PLUS IO SOLN
INTRAOCULAR | Status: DC | PRN
  Administered 2014-06-17: 1

## 2014-06-17 MED ORDER — LIDOCAINE HCL 2 % IJ SOLN
INTRAMUSCULAR | Status: AC
  Filled 2014-06-17: qty 20

## 2014-06-17 MED ORDER — TOBRAMYCIN-DEXAMETHASONE 0.3-0.1 % OP OINT
TOPICAL_OINTMENT | OPHTHALMIC | Status: AC
  Filled 2014-06-17: qty 3.5

## 2014-06-17 MED ORDER — TOBRAMYCIN-DEXAMETHASONE 0.3-0.1 % OP OINT
TOPICAL_OINTMENT | OPHTHALMIC | Status: DC | PRN
  Administered 2014-06-17: 1 via OPHTHALMIC

## 2014-06-17 MED ORDER — SODIUM CHLORIDE 0.9 % IJ SOLN
INTRAMUSCULAR | Status: AC
  Filled 2014-06-17: qty 10

## 2014-06-17 MED ORDER — LIDOCAINE HCL 4 % MT SOLN
OROMUCOSAL | Status: DC | PRN
  Administered 2014-06-17: 4 mL via TOPICAL

## 2014-06-17 MED ORDER — DEXAMETHASONE SODIUM PHOSPHATE 4 MG/ML IJ SOLN
INTRAMUSCULAR | Status: AC
  Filled 2014-06-17: qty 1

## 2014-06-17 MED ORDER — EPINEPHRINE HCL 1 MG/ML IJ SOLN
INTRAMUSCULAR | Status: AC
  Filled 2014-06-17: qty 1

## 2014-06-17 MED ORDER — PROPOFOL 10 MG/ML IV BOLUS
INTRAVENOUS | Status: AC
  Filled 2014-06-17: qty 20

## 2014-06-17 MED ORDER — VANCOMYCIN SUBCONJUNCTIVAL INJECTION 25 MG/0.5 ML
INTRAOCULAR | Status: DC | PRN
  Administered 2014-06-17: 50 mg via SUBCONJUNCTIVAL

## 2014-06-17 MED ORDER — PROMETHAZINE HCL 25 MG/ML IJ SOLN
6.2500 mg | INTRAMUSCULAR | Status: DC | PRN
  Administered 2014-06-17: 6.25 mg via INTRAVENOUS

## 2014-06-17 MED ORDER — PROPOFOL 10 MG/ML IV BOLUS
INTRAVENOUS | Status: DC | PRN
  Administered 2014-06-17: 200 mg via INTRAVENOUS

## 2014-06-17 MED ORDER — EPHEDRINE SULFATE 50 MG/ML IJ SOLN
INTRAMUSCULAR | Status: AC
Start: 2014-06-17 — End: 2014-06-17
  Filled 2014-06-17: qty 1

## 2014-06-17 MED ORDER — SODIUM HYALURONATE 10 MG/ML IO SOLN
INTRAOCULAR | Status: AC
  Filled 2014-06-17: qty 0.85

## 2014-06-17 MED ORDER — LIDOCAINE HCL (CARDIAC) 20 MG/ML IV SOLN
INTRAVENOUS | Status: AC
Start: 2014-06-17 — End: 2014-06-17
  Filled 2014-06-17: qty 5

## 2014-06-17 MED ORDER — STERILE WATER FOR IRRIGATION IR SOLN
Status: DC | PRN
  Administered 2014-06-17: 1000 mL

## 2014-06-17 MED ORDER — DEXAMETHASONE SODIUM PHOSPHATE 10 MG/ML IJ SOLN
INTRAMUSCULAR | Status: AC
  Filled 2014-06-17: qty 1

## 2014-06-17 MED ORDER — ONDANSETRON HCL 4 MG/2ML IJ SOLN
INTRAMUSCULAR | Status: AC
  Filled 2014-06-17: qty 2

## 2014-06-17 MED ORDER — MIDAZOLAM HCL 5 MG/5ML IJ SOLN
INTRAMUSCULAR | Status: DC | PRN
  Administered 2014-06-17: 2 mg via INTRAVENOUS

## 2014-06-17 MED ORDER — VANCOMYCIN HCL IN DEXTROSE 1-5 GM/200ML-% IV SOLN
INTRAVENOUS | Status: AC
  Filled 2014-06-17: qty 200

## 2014-06-17 MED ORDER — FENTANYL CITRATE 0.05 MG/ML IJ SOLN
INTRAMUSCULAR | Status: DC | PRN
  Administered 2014-06-17 (×2): 50 ug via INTRAVENOUS

## 2014-06-17 MED ORDER — ONDANSETRON HCL 4 MG/2ML IJ SOLN
INTRAMUSCULAR | Status: DC | PRN
  Administered 2014-06-17: 4 mg via INTRAVENOUS

## 2014-06-17 MED ORDER — TETRACAINE HCL 0.5 % OP SOLN
OPHTHALMIC | Status: AC
  Filled 2014-06-17: qty 2

## 2014-06-17 MED ORDER — FENTANYL CITRATE 0.05 MG/ML IJ SOLN
INTRAMUSCULAR | Status: AC
Start: 2014-06-17 — End: 2014-06-17
  Filled 2014-06-17: qty 5

## 2014-06-17 MED ORDER — BUPIVACAINE HCL (PF) 0.75 % IJ SOLN
INTRAMUSCULAR | Status: DC | PRN
  Administered 2014-06-17: 4 mL

## 2014-06-17 MED ORDER — NEOSTIGMINE METHYLSULFATE 10 MG/10ML IV SOLN
INTRAVENOUS | Status: DC | PRN
  Administered 2014-06-17: 4 mg via INTRAVENOUS

## 2014-06-17 MED ORDER — HYALURONIDASE HUMAN 150 UNIT/ML IJ SOLN
INTRAMUSCULAR | Status: AC
  Filled 2014-06-17: qty 1

## 2014-06-17 MED ORDER — MIDAZOLAM HCL 2 MG/2ML IJ SOLN: 0.5000 mg | Freq: Once | INTRAMUSCULAR | Status: DC | PRN

## 2014-06-17 MED ORDER — MIDAZOLAM HCL 2 MG/2ML IJ SOLN
INTRAMUSCULAR | Status: AC
  Filled 2014-06-17: qty 2

## 2014-06-17 MED ORDER — SODIUM CHLORIDE 0.9 % IV SOLN
INTRAVENOUS | Status: DC
  Administered 2014-06-17 (×4): via INTRAVENOUS

## 2014-06-17 MED ORDER — MEPERIDINE HCL 25 MG/ML IJ SOLN: 6.2500 mg | INTRAMUSCULAR | Status: DC | PRN

## 2014-06-17 MED ORDER — ROCURONIUM BROMIDE 50 MG/5ML IV SOLN
INTRAVENOUS | Status: AC
  Filled 2014-06-17: qty 1

## 2014-06-17 MED ORDER — HYALURONIDASE HUMAN 150 UNIT/ML IJ SOLN
INTRAMUSCULAR | Status: DC | PRN
  Administered 2014-06-17: 150 [IU]

## 2014-06-17 MED ORDER — DEXAMETHASONE SODIUM PHOSPHATE 4 MG/ML IJ SOLN
INTRAMUSCULAR | Status: DC | PRN
  Administered 2014-06-17: 4 mg via INTRAVENOUS

## 2014-06-17 MED ORDER — ATROPINE SULFATE 1 % OP OINT
TOPICAL_OINTMENT | OPHTHALMIC | Status: DC | PRN
  Administered 2014-06-17: 1 via OPHTHALMIC

## 2014-06-17 MED ORDER — ROCURONIUM BROMIDE 100 MG/10ML IV SOLN
INTRAVENOUS | Status: DC | PRN
  Administered 2014-06-17: 40 mg via INTRAVENOUS

## 2014-06-17 MED ORDER — ARTIFICIAL TEARS OP OINT
TOPICAL_OINTMENT | OPHTHALMIC | Status: AC
  Filled 2014-06-17: qty 3.5

## 2014-06-17 SURGICAL SUPPLY — 60 items
APL SRG 3 HI ABS STRL LF PLS (MISCELLANEOUS) ×1
APPLICATOR COTTON TIP 6IN STRL (MISCELLANEOUS) ×2 IMPLANT
APPLICATOR DR MATTHEWS STRL (MISCELLANEOUS) ×2 IMPLANT
BAND SCLERAL BUCKLING TYPE 42 (Ophthalmic Related) ×1 IMPLANT
BLADE MINI 60D BLUE (BLADE) ×2 IMPLANT
BLADE MINI RND TIP GREEN BEAV (BLADE) IMPLANT
CANNULA ANT CHAM MAIN (OPHTHALMIC RELATED) IMPLANT
CANNULA DUAL BORE 23G (CANNULA) IMPLANT
CANNULA FLEX TIP 25G (CANNULA) ×1 IMPLANT
CAUTERY EYE LOW TEMP 1300F FIN (OPHTHALMIC RELATED) ×2 IMPLANT
CORDS BIPOLAR (ELECTRODE) ×1 IMPLANT
COVER MAYO STAND STRL (DRAPES) ×2 IMPLANT
COVER SURGICAL LIGHT HANDLE (MISCELLANEOUS) ×2 IMPLANT
DRAPE INCISE 51X51 W/FILM STRL (DRAPES) ×1 IMPLANT
DRAPE PROXIMA HALF (DRAPES) ×2 IMPLANT
FILTER BLUE MILLIPORE (MISCELLANEOUS) IMPLANT
FILTER STRAW FLUID ASPIR (MISCELLANEOUS) IMPLANT
GAS AUTO FILL CONSTEL (OPHTHALMIC) ×2
GAS AUTO FILL CONSTELLATION (OPHTHALMIC) ×1 IMPLANT
GAS OPHTHALMIC (MISCELLANEOUS) ×1 IMPLANT
GLOVE BIO SURGEON STRL SZ7.5 (GLOVE) ×2 IMPLANT
GLOVE BIOGEL PI IND STRL 6.5 (GLOVE) IMPLANT
GLOVE BIOGEL PI INDICATOR 6.5 (GLOVE) ×2
GOWN STRL REUS W/ TWL LRG LVL3 (GOWN DISPOSABLE) ×2 IMPLANT
GOWN STRL REUS W/TWL LRG LVL3 (GOWN DISPOSABLE) ×4
KIT BASIN OR (CUSTOM PROCEDURE TRAY) ×2 IMPLANT
KIT PERFLUORON PROCEDURE 5ML (MISCELLANEOUS) IMPLANT
KIT ROOM TURNOVER OR (KITS) ×2 IMPLANT
LENS BIOM SUPER VIEW SET DISP (OPHTHALMIC RELATED) ×2 IMPLANT
NDL 18GX1X1/2 (RX/OR ONLY) (NEEDLE) ×1 IMPLANT
NDL HYPO 25GX1X1/2 BEV (NEEDLE) IMPLANT
NDL HYPO 30X.5 LL (NEEDLE) ×2 IMPLANT
NEEDLE 18GX1X1/2 (RX/OR ONLY) (NEEDLE) ×2 IMPLANT
NEEDLE HYPO 25GX1X1/2 BEV (NEEDLE) IMPLANT
NEEDLE HYPO 30X.5 LL (NEEDLE) ×4 IMPLANT
NS IRRIG 1000ML POUR BTL (IV SOLUTION) ×2 IMPLANT
PACK VITRECTOMY CUSTOM (CUSTOM PROCEDURE TRAY) ×2 IMPLANT
PAD ARMBOARD 7.5X6 YLW CONV (MISCELLANEOUS) ×4 IMPLANT
PAK PIK VITRECTOMY CVS 25GA (OPHTHALMIC) ×1 IMPLANT
PROBE ENDO DIATHERMY 25G (MISCELLANEOUS) ×2 IMPLANT
PROBE LASER ILLUM FLEX CVD 23G (OPHTHALMIC) IMPLANT
PROBE LASER ILLUM FLEX CVD 25G (OPHTHALMIC) ×2 IMPLANT
REPL STRA BRUSH NDL (NEEDLE) IMPLANT
REPL STRA BRUSH NEEDLE (NEEDLE) IMPLANT
RESERVOIR BACK FLUSH (MISCELLANEOUS) IMPLANT
RETRACTOR IRIS FLEX 25G GRIESH (INSTRUMENTS) IMPLANT
ROLLS DENTAL (MISCELLANEOUS) ×2 IMPLANT
SET FLUID INJECTOR (SET/KITS/TRAYS/PACK) IMPLANT
SLEEVE SCLERAL BUCK TYPE 70 (Ophthalmic Related) ×1 IMPLANT
STOCKINETTE IMPERVIOUS 9X36 MD (GAUZE/BANDAGES/DRESSINGS) ×5 IMPLANT
STOPCOCK 4 WAY LG BORE MALE ST (IV SETS) IMPLANT
SUT ETHILON 5.0 S-24 (SUTURE) ×3 IMPLANT
SUT SILK 2 0 (SUTURE) ×2
SUT SILK 2-0 18XBRD TIE 12 (SUTURE) ×1 IMPLANT
SUT VICRYL 7 0 TG140 8 (SUTURE) ×1 IMPLANT
SYR 20CC LL (SYRINGE) IMPLANT
TOWEL OR 17X24 6PK STRL BLUE (TOWEL DISPOSABLE) ×4 IMPLANT
TUBE CONNECTING 12X1/4 (SUCTIONS) ×1 IMPLANT
WATER STERILE IRR 1000ML POUR (IV SOLUTION) ×2 IMPLANT
WIPE INSTRUMENT VISIWIPE 73X73 (MISCELLANEOUS) ×1 IMPLANT

## 2014-06-17 NOTE — Progress Notes (Addendum)
Ben in pharmacy called and asked about pt allergy to Panmist, and that pt  Tolerated eye drops in  Dr [facility] office. Pt states that she had a reaction to sudafed.

## 2014-06-17 NOTE — Anesthesia Procedure Notes (Signed)
Procedure Name: Intubation Date/Time: 06/17/2014 3:15 PM Performed by: Willeen Cass P Pre-anesthesia Checklist: Timeout performed, Patient identified, Emergency Drugs available, Suction available and Patient being monitored Patient Re-evaluated:Patient Re-evaluated prior to inductionOxygen Delivery Method: Circle system utilized Preoxygenation: Pre-oxygenation with 100% oxygen Intubation Type: IV induction Ventilation: Mask ventilation without difficulty Laryngoscope Size: Mac and 3 Grade View: Grade II Tube size: 7.0 mm Number of attempts: 1 Airway Equipment and Method: Stylet and LTA kit utilized Placement Confirmation: ETT inserted through vocal cords under direct vision,  breath sounds checked- equal and bilateral and positive ETCO2 Secured at: 21 cm Tube secured with: Tape Dental Injury: Teeth and Oropharynx as per pre-operative assessment

## 2014-06-17 NOTE — Brief Op Note (Signed)
06/17/2014  5:40 PM  PATIENT:  Debra Cain  44 y.o. female  PRE-OPERATIVE DIAGNOSIS:  retinal detachment left eye  H33.012  POST-OPERATIVE DIAGNOSIS:  Retinal Detachment left eye  PROCEDURE:  Procedure(s): VITRECTOMY 25 GAUGE WITH SCLERAL BUCKLE (Left) GAS/FLUID EXCHANGE  C3F8 (Left)  SURGEON:  Surgeon(s) and Role:    * Sherlynn Stalls, MD - Primary  PHYSICIAN ASSISTANT:   ASSISTANTS: none   ANESTHESIA:   general  EBL:  Total I/O In: 1500 [I.V.:1500] Out: -   BLOOD ADMINISTERED:none  DRAINS: none   LOCAL MEDICATIONS USED:  BUPIVICAINE   SPECIMEN:  No Specimen  DISPOSITION OF SPECIMEN:  N/A  COUNTS:  YES  TOURNIQUET:  * No tourniquets in log *  DICTATION: .Other Dictation: Dictation Number [id number]  PLAN OF CARE: Discharge to home after PACU  PATIENT DISPOSITION:  PACU - hemodynamically stable.   Delay start of Pharmacological VTE agent (>24hrs) due to surgical blood loss or risk of bleeding: not applicable

## 2014-06-17 NOTE — Transfer of Care (Signed)
Immediate Anesthesia Transfer of Care Note  Patient: Debra Cain  Procedure(s) Performed: Procedure(s): VITRECTOMY 25 GAUGE WITH SCLERAL BUCKLE (Left) GAS/FLUID EXCHANGE  C3F8 (Left)  Patient Location: PACU  Anesthesia Type:General  Level of Consciousness: awake, alert , oriented and patient cooperative  Airway & Oxygen Therapy: Patient Spontanous Breathing and Patient connected to nasal cannula oxygen  Post-op Assessment: Report given to RN, Post -op Vital signs reviewed and stable and Patient moving all extremities  Post vital signs: Reviewed and stable  Complications: No apparent anesthesia complications

## 2014-06-17 NOTE — Anesthesia Preprocedure Evaluation (Addendum)
Anesthesia Evaluation  Patient identified by MRN, date of birth, ID band Patient awake    Reviewed: Allergy & Precautions, NPO status , Patient's Chart, lab work & pertinent test results, reviewed documented beta blocker date and time   History of Anesthesia Complications Negative for: history of anesthetic complications  Airway Mallampati: I  TM Distance: >3 FB Neck ROM: Full    Dental  (+) Dental Advisory Given, Teeth Intact   Pulmonary asthma (well-controlled) ,  breath sounds clear to auscultation        Cardiovascular negative cardio ROS  Rhythm:Regular Rate:Normal     Neuro/Psych negative neurological ROS     GI/Hepatic Neg liver ROS, GERD-  Medicated and Controlled,  Endo/Other  negative endocrine ROS  Renal/GU negative Renal ROS     Musculoskeletal   Abdominal   Peds  Hematology negative hematology ROS (+)   Anesthesia Other Findings   Reproductive/Obstetrics 06/17/14 preg test: neg                            Anesthesia Physical Anesthesia Plan  ASA: II  Anesthesia Plan: General   Post-op Pain Management:    Induction: Intravenous  Airway Management Planned: Oral ETT  Additional Equipment:   Intra-op Plan:   Post-operative Plan: Extubation in OR  Informed Consent: I have reviewed the patients History and Physical, chart, labs and discussed the procedure including the risks, benefits and alternatives for the proposed anesthesia with the patient or authorized representative who has indicated his/her understanding and acceptance.   Dental advisory given  Plan Discussed with: CRNA and Surgeon  Anesthesia Plan Comments: (Plan routine monitors, GETA)        Anesthesia Quick Evaluation

## 2014-06-17 NOTE — Discharge Instructions (Signed)
Retinal Detachment The retina is a thin, light-sensitive layer that lines the inside of the back of the eye. The retina changes visual images into nerve impulses which are sent to the brain by the optic nerve to produce sight. It must be attached to the back of the eye in order to function. All detailed vision and color perception happens at a precise pin-size point of focus on the retina called the macula. The other 99.9% of retinal surface is for side vision (what your eye can see just off to the side of what you are looking at). A detached retina is the separation of the retina from the back of the eye. A total retinal detachment is when the entire retina separates from the layers below it, and includes the macula. This causes complete blindness in the affected eye. It is more common for just a portion of the retina to detach, in many cases leaving the macula in place. When this happens, central, focused vision is maintained, but a portion of side vision corresponding to the part of the retina that detached is lost. This results in a portion of side vision that is black - like a curtain covering a portion of side vision. CAUSES   One or many small holes or tears in the retinal surface. These holes and tears allow the fluids inside the eyeball (vitreous fluid) to get through the opening and lift the retina up and away from its underlying layers. Since the vitreous fluid is jelly-like in nature and constricts with time, it has the affect of pulling on the retina and can cause holes or tears. People who are very nearsighted are at a higher risk for retinal holes or tears.  Trauma to the eye.  Certain diseases (diabetes, blood clotting disorders, and others).  Certain physical abnormalities of the eye. SYMPTOMS   Flashes of lights that are caused by the vitreous pulling on the retina.  Floating specks or cobwebs (floaters) seen in front of your eye. These may be caused by bleeding in the eye from a  hole or tear of the retina.  A black area or "curtain" that you cannot see through in any part of your side vision.  Detailed vision becomes fuzzy which may be caused by the vitreous pulling on the macula.  A floating empty circle in front of your vision which may be caused by the vitreous pulling away from the optic nerve. This is very common and usually does not affect vision (posterior vitreal detachment). DIAGNOSIS  The diagnosis is made with an exam by an ophthalmologist. The pupils of the patient are made larger (dilated). Retinal detachment can be more easily found if the detachment is large. If it is small or flat, the detachment may be harder to find. Location of retinal holes and tears need an extremely high degree of training and skill. They may need the expertise of an ophthalmologist who specializes in diseases of the retina. Both eyes should be thoroughly examined since holes or tears may exist in both eyes. TREATMENT  Treatment depends on the location, size and nature of the retinal detachment.  Small, localized detachments may be treated on an outpatient basis using a laser.  Larger detachments need surgery to drain the fluid and use a freezing probe to create scar tissue. The scar tissue will help make the retina adhere to its underlying tissues once it flattens out.  Methods also exist which involve the injection of fluid or air into the vitreous cavity.   Visual improvement or return depends on:  How long the retina was detached.  Whether or not the macula was involved.  How well any blood within the vitreous cavity clears with time - and many other factors. HOME CARE INSTRUCTIONS   If you have a retinal detachment, and you have not been examined by an eye specialist (either an ophthalmologist or a retinal specialist), you must be examined by one of these specialists as soon as possible.  Home care instruction should be given by the retina specialist depending on the type  of detachment and the surgery or method that was used to treat it. SEEK IMMEDIATE MEDICAL CARE IF:   You see the sudden onset of flashing lights, floaters and/or a dark area (like a curtain) in any area of your field of version.  The dark area of visual loss is in the lower part of the visual field. This means that the upper retina may have detached. Since fluid runs down, the risk is greater in this type of detachment that the fluid may work its way downward to detach the macula. Any detachment that involves  Scleral Buckling  Scleral buckling is a procedure to repair a retinal detachment. A retinal detachment is when the retina layer pulls away from the inside surface of the eye. Sometimes, this is an emergency procedure. During this procedure, a silicone rubber or silicone sponge "buckle" is placed on the outside of the eye and over the detachment inside the eye. A circling band may be positioned to indent the wall of the eye inward, which keeps the retina in place. Scleral buckling may be performed as an inpatient or outpatient procedure at a hospital or outpatient center. In some cases, you may need to stay in the hospital for 2-3 days. Most people can go home the same day. LET YOUR CAREGIVER KNOW ABOUT: Any allergies. All medicines taken, including vitamins, herbs, eye drops, over-the-counter medicines, and creams. Use of steroids. Previous problems with anesthetics, including local anesthesia. History of bleeding or blood problems. Previous surgery. Smoking history. Any health problems. RISKS AND COMPLICATIONS Reaction to anesthesia. Damage to surrounding nerves, tissues, or structures. Infection. Scarring on the central part of the retina (macular pucker). Bleeding under the retina. Accumulation of fluid in the eye. The outer wall of the eyeball may open (scleral perforation). Cataract formation. Glaucoma. Fluid in the central part of the retina (cystoid macular edema). Double  vision. Changes in eyesight. Loss of vision. BEFORE THE PROCEDURE You may need an eye exam. You may need a physical exam and medical history review. Ask your caregiver if you need to stop or change any regular medicines. Avoid eating or drinking 7 to 8 hours before the procedure, or as directed. Quit smoking, if you smoke. Arrange for someone to drive you home after the procedure and for someone to help you at home afterward. PROCEDURE You will get eye drops in your eye. The eye drops will dilate your pupil. This allows your caregiver to see inside your eye. You will be given a medicine to numb the eye (local anesthetic) or a medicine to make you sleep (general anesthetic) during the procedure. You may be asked to stay in a certain position until the procedure is done. The position you may need to stay in will depend on what portion of the retina is detached. Your eyelashes may be clipped to help the area around the eye stay clean during the procedure. A germ-free solution (antiseptic) will be applied to  the area around the eye. Your face will be covered with gauze to avoid contamination. An antibiotic solution may be put into your eye before the procedure to prevent an infection. Incisions will be made to relax your eye. These incisions will also help your caregiver carefully move through muscles and other layers of the eye. A buckle, with pre-positioned sutures, will be placed over the white outer layer of the eye called the "sclera." The buckle will be secured into place temporarily. After the buckle is adjusted into the final position, the sutures are tied. The eye is flushed out and an antibiotic solution is applied to the eye. All incisions are closed with sutures. Fluid may be drained from the eye that collected under the retinal detachment. The drainage site will be closed with sutures. Sometimes, draining is not done. AFTER THE PROCEDURE Your eye will be covered with a bandage. You  may have some pain. You will be given medicine to control any discomfort. Your pain may be controlled with cold packs. You will need help with activities. Document Released: 09/25/2011 Document Revised: 08/10/2013 Document Reviewed: 09/25/2011 [facility] Patient Information 2015 [place], [place]. This information is not intended to replace advice given to you by your health care provider. Make sure you discuss any questions you have with your health care provider.   Vitrectomy Care After Refer to this sheet in the next few weeks. These instructions provide you with information on caring for yourself after your procedure. Your caregiver may also give you more specific instructions. Your treatment has been planned according to current medical practices, but problems sometimes occur. Call your caregiver if you have any problems or questions after your procedure. HOME CARE INSTRUCTIONS If you are instructed to stay in a certain position for a certain amount of time, it is important to do so. Positions include sitting up or lying on your back. The reasons for certain positioning depends on the reason you had the surgery and the type of vitrectomy performed. Ask your caregiver if you have to remain in a certain position for a certain amount of time.  Wear your eye patch for the first day, or as directed by your caregiver. Keep the area around your eye clean and dry. Put any eyedrops in your eyes as directed by your caregiver. Wear the plastic shield over your eye when you sleep if you are instructed to do so. This is to protect the eye from being accidentally bumped or jabbed, or having too much pressure put on it. Avoid any strenuous physical activity for as long as directed by your caregiver. This includes bending over, lifting anything over 5 pounds (2.3 kg), or straining. Talk to your caregiver about when it may be safe to resume sexual activity. If any of your regular medicines were stopped before  surgery, carefully follow your caregiver's instructions about which medicines to restart and when to restart them. You may be instructed not to use blood thinners or aspirin. Continue with your normal diet unless directed otherwise by your caregiver. If you are diabetic, stay on your diabetic diet, or use your insulin as directed by your caregiver. It is okay to use your other eye for reading and watching television. Do not drive a car or use contact lenses until your caregiver says it is okay.  SEEK MEDICAL CARE IF: Your eye becomes very red or painful. You develop any pus or discharge from the eye. You have chills. Your eyelids on either eye become swollen or stuck  shut. SEEK IMMEDIATE MEDICAL CARE IF: You have a fever. You notice a change in vision in either eye. You see more floaters or spots in front of your vision. Part of your side vision is black and you cannot see through it.It may feel like a shade is being pulled toward the center of your vision from any direction. Document Released: 12/12/2010 Document Revised: 06/18/2011 Document Reviewed: 12/12/2010 [facility] Patient Information 2015 [place], [place]. This information is not intended to replace advice given to you by your health care provider. Make sure you discuss any questions you have with your health care provider.  the macula makes the risk of permanent visual loss much higher, even with treatment. Document Released: 03/26/2005 Document Revised: 08/10/2013 Document Reviewed: 02/25/2009 [facility] Patient Information 2015 [place], [place]. This information is not intended to replace advice given to you by your health care provider. Make sure you discuss any questions you have with your health care provider.

## 2014-06-17 NOTE — Progress Notes (Signed)
Allergy noted to panmist. Pt states she is allergic to pseudoephedrine, and not the sodium. Dr Baird Cancer office called spoke with Estill Bamberg and asked about order for phenylephrine and pt allergies. Estill Bamberg states that she had phenylephrine in the office and did fine

## 2014-06-17 NOTE — H&P (Signed)
Debra Cain is an 44 y.o. female.   Chief Complaint: Vision loss OS  HPI: vision loss os due to vitreous hemorrhage. RD found on ultrasound.  Past Medical History  Diagnosis Date  . Chronic interstitial cystitis   . Anxiety   . Detached retina     left eye  . GERD (gastroesophageal reflux disease)     takes pepcid  . Complication of anesthesia     slow to wake up    Past Surgical History  Procedure Laterality Date  . Appendectomy  07/1996  . Urology surgery  06/23/01  . Eye surgery Bilateral     Lasik     Family History  Problem Relation Age of Onset  . Colitis Father    Social History:  reports that she has never smoked. She has never used smokeless tobacco. She reports that she does not drink alcohol or use illicit drugs.  Allergies:  Allergies  Allergen Reactions  . Augmentin [Amoxicillin-Pot Clavulanate] Hives and Swelling    Throat swelling  . Ciprofloxacin Hives and Swelling    Throat swelling  . Levaquin [Levofloxacin] Hives and Swelling    Throat swelling  . Macrobid [Nitrofurantoin Macrocrystal] Hives and Swelling    Throat swelling  . Minocycline Hives and Swelling    Throat swelling  . Pseudoephedrine Swelling    Hives and swelling    Medications Prior to Admission  Medication Sig Dispense Refill  . Biotin 5000 MCG CAPS Take 10,000 mcg by mouth daily.    . chlorpheniramine (CHLOR-TRIMETON) 4 MG tablet Take 4-8 mg by mouth 2 (two) times daily. Take 1 tablet (4 mg) every morning and 2 tablets (8 mg) every night    . famotidine (PEPCID) 20 MG tablet Take 20 mg by mouth at bedtime.    . gabapentin (NEURONTIN) 300 MG capsule TAKE 1 CAPSULE BY MOUTH THREE TIMES A DAY (Patient taking differently: TAKE 1 CAPSULE BY MOUTH TWO TIMES A DAY) 90 capsule 2  . montelukast (SINGULAIR) 10 MG tablet Take 1 tablet (10 mg total) by mouth at bedtime. 30 tablet 11  . prednisoLONE acetate (PRED FORTE) 1 % ophthalmic suspension Place 1 drop into the left eye 4 (four) times  daily. Start 1 day before surgery (scheduled for 06/17/14) and continue as directed    . sertraline (ZOLOFT) 50 MG tablet Take 50 mg by mouth daily.    Marland Kitchen tobramycin (TOBREX) 0.3 % ophthalmic solution Place 1 drop into the left eye 4 (four) times daily. Start 1 day before surgery (scheduled for 06/17/14) and continue as directed    . ALPRAZolam (XANAX) 0.5 MG tablet Take 1 tablet (0.5 mg total) by mouth 2 (two) times daily as needed for anxiety. 30 tablet 1  . azithromycin (ZITHROMAX) 250 MG tablet Take 1 tablet (250 mg total) by mouth as directed. (Patient not taking: Reported on 06/16/2014) 6 tablet 0  . gabapentin (NEURONTIN) 100 MG capsule Take 1 capsule (100 mg total) by mouth 3 (three) times daily. One three times daily (Patient not taking: Reported on 06/16/2014) 90 capsule 2  . pantoprazole (PROTONIX) 40 MG tablet TAKE 1 TABLET BY MOUTH DAILY. TAKE 30-60MIN BEFORE FIRST MEAL OF THE DAY (Patient not taking: Reported on 06/16/2014) 30 tablet 5  . traMADol (ULTRAM) 50 MG tablet Take 1 tablet (50 mg total) by mouth every 6 (six) hours as needed. (Patient not taking: Reported on 06/16/2014) 40 tablet 0    Results for orders placed or performed during the hospital encounter  of 06/17/14 (from the past 48 hour(s))  hCG, serum, qualitative     Status: None   Collection Time: 06/17/14 12:57 PM  Result Value Ref Range   Preg, Serum NEGATIVE NEGATIVE    Comment:        THE SENSITIVITY OF THIS METHODOLOGY IS >10 mIU/mL.   CBC     Status: Abnormal   Collection Time: 06/17/14 12:57 PM  Result Value Ref Range   WBC 6.9 4.0 - 10.5 K/uL   RBC 5.27 (H) 3.87 - 5.11 MIL/uL   Hemoglobin 14.7 12.0 - 15.0 g/dL   HCT 42.6 36.0 - 46.0 %   MCV 80.8 78.0 - 100.0 fL   MCH 27.9 26.0 - 34.0 pg   MCHC 34.5 30.0 - 36.0 g/dL   RDW 12.0 11.5 - 15.5 %   Platelets 369 150 - 400 K/uL   No results found.  Review of Systems  Eyes: Positive for blurred vision.  All other systems reviewed and are negative.   Blood  pressure 181/91, pulse 85, temperature 97.8 F (36.6 C), temperature source Oral, resp. rate 20, height 5\' 5"  (1.651 m), weight 56.7 kg (125 lb), last menstrual period 06/02/2014, SpO2 100 %. Physical Exam  Eyes:  Fundoscopic exam:      The left eye shows hemorrhage.       Assessment/Plan 1. VIt heme and RD OS: PPV/SBP/AFX/GFX/EC/EL OS.  Tarnesha Ulloa B 06/17/2014, 2:57 PM

## 2014-06-18 ENCOUNTER — Encounter (INDEPENDENT_AMBULATORY_CARE_PROVIDER_SITE_OTHER): Payer: Self-pay | Admitting: Ophthalmology

## 2014-06-18 NOTE — Anesthesia Postprocedure Evaluation (Signed)
  Anesthesia Post-op Note  Patient: Debra Cain  Procedure(s) Performed: Procedure(s): VITRECTOMY 25 GAUGE WITH SCLERAL BUCKLE (Left) GAS/FLUID EXCHANGE  C3F8 (Left)  Patient Location: PACU  Anesthesia Type:General  Level of Consciousness: awake, alert , oriented and patient cooperative  Airway and Oxygen Therapy: Patient Spontanous Breathing  Post-op Pain: none  Post-op Assessment: Post-op Vital signs reviewed, Patient's Cardiovascular Status Stable, Respiratory Function Stable, Patent Airway, No signs of Nausea or vomiting, Adequate PO intake and Pain level controlled  Post-op Vital Signs: Reviewed and stable  Last Vitals:  Filed Vitals:   06/17/14 1948  BP:   Pulse: 103  Temp:   Resp:     Complications: No apparent anesthesia complications

## 2014-06-18 NOTE — Op Note (Signed)
NAMEMarland Kitchen  Debra, Cain NO.:  [id number]  MEDICAL RECORD NO.:  [id number]  LOCATION:  [facility]                         FACILITY:  [facility]  PHYSICIAN:  Corliss Parish, MD    DATE OF BIRTH:  03-22-1971  DATE OF PROCEDURE:  06/17/2014 DATE OF DISCHARGE:  06/17/2014                              OPERATIVE REPORT   SURGEON:  Corene Cornea B. Baird Cancer, MD  ANESTHESIA:  General with endotracheal intubation.  PREOPERATIVE DIAGNOSIS:  Vitreous hemorrhage and retinal detachment, left eye.  POSTOPERATIVE DIAGNOSIS:  Vitreous hemorrhage and retinal detachment, left eye.  OPERATIVE PROCEDURE:  Scleral buckle with 25-gauge pars plana vitrectomy to repair of complex retinal detachment.  COMPLICATIONS:  None.  FINDINGS:  There was a dense vitreous hemorrhage and eight retinal breaks, three of them were greater than 1 o'clock hour.  DESCRIPTION OF PROCEDURE:  Debra Cain was identified in the preoperative holding area.  She was then taken to the operating room where she was sedated and placed under general anesthesia.  At that point in time, the left eye was anesthetized using a retrobulbar block consisting of a 1:1 mixture of 0.75% bupivacaine and 1% lidocaine with 150 units of Halonix. After excellent akinesia and anesthesia was obtained, the left eye was prepped and draped in usual sterile fashion per Ocular Surgery including trimming of lashes.  A Lieberman speculum was placed between the left upper and lower eyelids for exposure.  A 360-degree conjunctival peritomy was performed with 0.3 forceps and Westcott scissors.  Stevens tenotomy scissors were used to dissect the tenon's capsule off the sclera on all four quadrants.  The four recti muscles were then isolated and looped using 2-0 silk suture.  Next, 5-0 nylon sutures were placed in each of the oblique quadrants in a horizontal mattress fashion. Following this, a #42 scleral buckle was used to encircle the eye with the ends of  buckle brought in the inferonasal quadrant joint with a #70 sleeve.  After the buckle was placed, the 5-0 nylon sutures were tied and knots rotated posteriorly and trimmed.  Excess buckle material was trimmed and discarded.  Attention was then directed towards the vitrectomy portion of the case.  The 25-gauge trocars were used to introduce transscleral cannulas in the inferotemporal, superonasal and inferotemporal location.  After this was completed, an infusion cannula was attached to the inferotemporal cannula and confirmed to be in the vitreous cavity prior to its use.  The eye then underwent a core vitrectomy with vitreous cutter and light pipe.  Dense vitreous hemorrhage was encountered and removed.  Vitreous was trimmed out to the periphery.  Numerous retinal breaks were identified and marked using endocautery.  Next, a soft-tipped extrusion cannula was used to perform an air-fluid exchange.  After the air-fluid exchange was completed, there was minimal amount of posterior subretinal fluid, remained.  An endocautery probe was used to perform a small retinotomy in the superior posterior pole.  The endocautery probe was removed and a soft-tipped extrusion cannula was used to extrude the remaining posterior subretinal fluid.  This was performed without difficulty.  Next, endolaser photocoagulation was applied around all retinal breaks and the retinotomy.  1600 spots  of laser were applied without difficulty.  The probe was removed.  At this point, the eye was then in very good shape. The retina was attached.  Next, the eye was flushed with a 14% concentration of C3F8 gas.  Following this, the cannulas were removed from the sclerotomies.  These sclerotomies were inspected and found to be air tight.  After the sclerotomies were closed, the 2-0 silk sutures were then removed from the muscle insertions.  Tenon's capsule and conjunctiva were reapproximated to the limbus using a 7-0 Vicryl  suture. After this was completed, the eye was treated with subconjunctival injections of 15 mg of vancomycin and 1 mg of dexamethasone.  The eye was treated tropically with one drop of 1% atropine and TobraDex ointment.  During the case, it was necessary to debride the corneal epithelium to allow for visualization of the vitreous cavity.  This was performed very delicately with the 69 blade.  The patient has a history of LASIK surgery and there was care ensured to not disrupt the LASIK flap.  The LASIK flap remained completely stable during the entire case including the epithelial debridement.  Once this was completed, the speculum was removed, the eyelids were cleaned and closed and then patched and shielded.  The patient was then awakened and taken to the recovery in excellent condition having tolerated the procedure well.     Corliss Parish, MD     JBS/MEDQ  D:  06/17/2014  T:  06/18/2014  Job:  [id number]

## 2014-06-20 ENCOUNTER — Encounter (HOSPITAL_COMMUNITY): Payer: Self-pay | Admitting: Ophthalmology

## 2014-07-19 ENCOUNTER — Encounter: Payer: Self-pay | Admitting: Internal Medicine

## 2014-07-19 ENCOUNTER — Ambulatory Visit (INDEPENDENT_AMBULATORY_CARE_PROVIDER_SITE_OTHER): Payer: No Typology Code available for payment source | Admitting: Internal Medicine

## 2014-07-19 VITALS — BP 120/80 | HR 77 | Ht 65.0 in | Wt 130.8 lb

## 2014-07-19 DIAGNOSIS — R05 Cough: Secondary | ICD-10-CM

## 2014-07-19 DIAGNOSIS — R058 Other specified cough: Secondary | ICD-10-CM

## 2014-07-19 MED ORDER — GABAPENTIN 300 MG PO CAPS: ORAL_CAPSULE | ORAL | Status: DC

## 2014-07-19 NOTE — Patient Instructions (Addendum)
Increase gabapentin to 300 three times a day   Please schedule a follow up visit in 3 months but call sooner if needed (we may be calling regarding a rstudy drug for cough but no reason to take it if you are 100% happy with the new dose of neurontin)

## 2014-07-19 NOTE — Assessment & Plan Note (Signed)
-   neurontin 100 tid 11/02/2013 >  Increase to 300 tid 11/13/2013 >  11/19/2013 called to report all better but 11/30/2013 reduced neurontin to 300 bid due to cns effects - 07/19/2014 rechallenged with gabapentin 300 tid   Improved and good sign no longer needing tramadol/ cough much less violent/ only daytime and less intense. Most c/w with irritable larynx/ good candidate for the new study drug whenver the trial gets started but for now rec max gabapentin if tol plus singulair/ 1st gen H1 and gerd rx

## 2014-07-19 NOTE — Progress Notes (Signed)
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 Does have h/o ?EIA ? Fixed cough/ difficulty breathing always using with clogging   History of Present Illness  11/02/2013 1st [facility] office visit/ Debra Cain  Chief Complaint  Patient presents with  . Pulmonary Consult    Self referral. Pt c/o cough x 5 yrs.   Cough is non prod, sometimes until the point of vomiting.  Worse while eating and is triggered by certain smells.   onset was insidious with lots of throat clearing, pattern has been persistent x 5 y  [facility]/ ent dx was acid reflux rx x years and no better. [facility] w/u also unhelpful, does not remember what dept but sent for speech therapy and failed to keep appt. Cough is dry more than wet, day >  Night, chokes when eats lunch, p smells like at work. Rarely does gets sob unless coughing maybe once a month with clogging, better p saba  eia in hs only soccer and cheerleading Allergy testing was neg on [address]  Started taking h1 and h2 can't really tell much yet  rec Pantoprazole (protonix) 40 mg   Take 30-60 min before first meal of the day and Pepcid 20 mg one bedtime until return to office and chlortrimeton 4 mg at bedtime GERD diet . Gabapentin 100 mg three times a day  Schedule an appt in 2 weeks at your convenience when we can do heavy cough suppression.     11/13/2013 f/u ov/Debra Cain re: chronic cough x 5y daily  Chief Complaint  Patient presents with  . Follow-up    Pt states that slightly improved. She was able to eat a meal yesterday w/o any cough.  No new co's today.       Kouffman Reflux v Neurogenic Cough Differentiator Reflux Comments  Do you awaken from a sound sleep coughing violently?                            With trouble breathing? x1 since ov   Do you have  choking episodes when you cannot  Get enough air, gasping for air ?              Yes   Do you usually cough when you lie down into  The bed, or when you just lie down to rest ?                          no   Do you usually cough after meals or eating?         definitely   Do you cough when (or after) you bend over?    Not now   GERD SCORE     Kouffman Reflux v Neurogenic Cough Differentiator Neurogenic   Do you more-or-less cough all day long? Clears throat less freq   Does change of temperature make you cough? Yes,heat   Does laughing or chuckling cause you to cough? yes   Do fumes (perfume, automobile fumes, burned  Toast, etc.,) cause you to cough ?      yes   Does speaking, singing, or talking on the phone cause you to cough   ?  yes   Neurogenic/Airway score      Also severe cough with mint toothpaste  rec No mint at all  Of any type Increase neurontin to 300 mg three times a day  Change omeprazole to 30 min before supper Prednisone 10 mg take  4 each am x 2 days,   2 each am x 2 days,  1 each am x 2 days and stop  Take delsym two tsp every 12 hours and supplement if needed with  tramadol 50 mg up to 2 every 4 hours to suppress the urge to cough.   11/30/2013 f/u ov/Debra Cain re: still on neurontin 300 mg bid / could not tol the 300 tid dose but did help alot Chief Complaint  Patient presents with  . Follow-up    Nonprod cough is present but improved since last visit.   was 100% elimin x 5 days on tramadol and high dose neurontin then p reducing the tramadol started back with occ dry cough - no particular time cough flares but rarely at hs.  Not using 1st gen h1 x at hs. Using no more than 2 tramadol per day. rec No change in medications for now but try to use less tramadol and if needed more chlortrimeton 4 mg take 2 at time and every 4 hours during the day for cough throat tickle, choking, drainage   01/11/2014 f/u ov/Debra Cain re: chronic cough on chlortrimeton / neurontin  300 bid omeprazole q am / pepcid Chief Complaint  Patient presents with  . Follow-up    Pt states that cough have been worse since she developed a cold approx 3 days ago. Cough is non prod.     cough was better and only rare choking / vomiting with new  baseline  =   first thing in am but does not wake her prematurely and no longer using any tramadol or delsym,  Then acutely ill x 3 days much worse than baseline in setting of apparent uri with nasal congestion/ scratchy throat but still no excess or purulent sputum, sob, cp or fever   rec Pantoprazole (protonix) 40 mg   Take 30-60 min before first meal of the day and Pepcid 20 mg one bedtime until return to office - this is the best way to tell whether stomach acid is contributing to your problem.  Prednisone 10 mg take  4 each am x 2 days,   2 each am x 2 days,  1 each am x 2 days and stop  For cough > delsym/tessilon as needed GERD diet   03/01/2014 f/u ov/Debra Cain re: cough x 10 y on neurontin 300 1-2 x daiy Chief Complaint  Patient presents with  . Follow-up    Pt states that her cough is unchnaged. No new co's today.    Cough is sporadic / better while sleeping, no more gag and vomit - " better than it's been in years" but still present, no longer using tramadol  Tessalon not really helping though nor is delsym.  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 Follow-up    pt  has non productive cough, pt denies wheezining,chest tightness, sob.  cough is completely sporadic, unpredictable/ much worse with uri now resolved but coughing fits at least once daily, never noct  rec Add  neurontin 100 mg up to 300 mg three times if you need = 900 mg maximum per day   07/19/2014 f/u ov/Debra Cain re: cough x 10 years improved on gabapentin 300 mg bid / singlair/ gerd rx  Chief Complaint  Patient presents with  . Follow-up    Pt states that her cough is about the same. No new co's today.   still has one coughing fit per day but less intense / no pattern as to when  Not using any tramadol at all.   No obvious  day to day or daytime variabilty or assoc sob (if not coughing) or cp or chest tightness, subjective wheeze overt sinus or hb symptoms. No unusual exp hx or h/o childhood pna/ asthma or knowledge of premature birth.  Sleeping ok without nocturnal  or early am exacerbation  of respiratory  c/o's or need for noct saba. Also denies any obvious fluctuation of symptoms with weather or environmental changes or other aggravating or alleviating factors except as outlined above   Current Medications, Allergies, Complete Past Medical History, Past Surgical History, Family History, and Social History were reviewed in [employer] record.  ROS  The following are not active complaints unless bolded sore throat, dysphagia, dental problems, itching, sneezing,  nasal congestion or excess/ purulent secretions, ear ache,   fever, chills, sweats, unintended wt loss, pleuritic or exertional cp, hemoptysis,  orthopnea pnd or leg swelling, presyncope, palpitations, heartburn, abdominal pain, anorexia, nausea, vomiting, diarrhea  or change in bowel or urinary habits, change in stools or urine, dysuria,hematuria,  rash, arthralgias, visual complaints, headache, numbness weakness or ataxia or problems with walking or coordination,  change in mood/affect or memory.              Objective:   Physical Exam  amb wf nad   11/13/2013         128  > 11/30/2013  129 > 01/11/2014  129 > 03/01/2014 132> 04/12/2014  131 > 07/19/2014  133  Wt Readings from Last 3 Encounters:  11/02/13 125 lb  (56.7 kg)  10/19/13 126 lb (57.153 kg)  04/21/12 124 lb 9.6 oz (56.518 kg)      HEENT: nl dentition, turbinates, and orophanx which remains  pristine. Nl external ear canals without cough reflex   NECK :  without JVD/Nodes/TM/ nl carotid upstrokes bilaterally   LUNGS: no acc muscle use, clear to A and P bilaterally with  cough on insp  maneuvers   CV:  RRR  no s3 or murmur or increase in P2, no edema   ABD:  soft and nontender with nl excursion in the supine position. No bruits or organomegaly, bowel sounds nl  MS:  warm without deformities, calf tenderness, cyanosis or clubbing         Assessment & Plan:

## 2014-10-18 ENCOUNTER — Ambulatory Visit: Payer: No Typology Code available for payment source | Admitting: Internal Medicine

## 2014-11-17 ENCOUNTER — Encounter: Payer: Self-pay | Admitting: Internal Medicine

## 2014-11-17 ENCOUNTER — Ambulatory Visit (INDEPENDENT_AMBULATORY_CARE_PROVIDER_SITE_OTHER): Payer: No Typology Code available for payment source | Admitting: Internal Medicine

## 2014-11-17 VITALS — BP 118/70 | HR 97 | Ht 65.0 in | Wt 131.0 lb

## 2014-11-17 DIAGNOSIS — R05 Cough: Secondary | ICD-10-CM

## 2014-11-17 DIAGNOSIS — R058 Other specified cough: Secondary | ICD-10-CM

## 2014-11-17 MED ORDER — TRAMADOL HCL 50 MG PO TABS: ORAL_TABLET | ORAL | Status: DC

## 2014-11-17 MED ORDER — PREDNISONE 10 MG PO TABS: ORAL_TABLET | ORAL | Status: DC

## 2014-11-17 MED ORDER — PANTOPRAZOLE SODIUM 40 MG PO TBEC: 40.0000 mg | DELAYED_RELEASE_TABLET | Freq: Every day | ORAL | Status: DC

## 2014-11-17 NOTE — Progress Notes (Signed)
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 Does have h/o ?EIA ? Fixed cough/ difficulty breathing always using with clogging   History of Present Illness  11/02/2013 1st [facility] office visit/ [name]  Chief Complaint  Patient presents with  . Pulmonary Consult    Self referral. Pt c/o cough x 5 yrs.   Cough is non prod, sometimes until the point of vomiting.  Worse while eating and is triggered by certain smells.   onset was insidious with lots of throat clearing, pattern has been persistent x 5 y  [facility]/ ent dx was acid reflux rx x years and no better. [facility] w/u also unhelpful, does not remember what dept but sent for speech therapy and failed to keep appt. Cough is dry more than wet, day >  Night, chokes when eats lunch, p smells like at work. Rarely does gets sob unless coughing maybe once a month with clogging, better p saba  eia in hs only soccer and cheerleading Allergy testing was neg on [address]  Started taking h1 and h2 can't really tell much yet  rec Pantoprazole (protonix) 40 mg   Take 30-60 min before first meal of the day and Pepcid 20 mg one bedtime until return to office and chlortrimeton 4 mg at bedtime GERD diet . Gabapentin 100 mg three times a day  Schedule an appt in 2 weeks at your convenience when we can do heavy cough suppression.     11/13/2013 f/u ov/Lei Dower re: chronic cough x 5y daily  Chief Complaint  Patient presents with  . Follow-up    Pt states that slightly improved. She was able to eat a meal yesterday w/o any cough.  No new co's today.       Kouffman Reflux v Neurogenic Cough Differentiator Reflux Comments  Do you awaken from a sound sleep coughing violently?                            With trouble breathing? x1 since ov   Do you have  choking episodes when you cannot  Get enough air, gasping for air ?              Yes   Do you usually cough when you lie down into  The bed, or when you just lie down to rest ?                          no   Do you usually cough after meals or eating?         definitely   Do you cough when (or after) you bend over?    Not now   GERD SCORE     Kouffman Reflux v Neurogenic Cough Differentiator Neurogenic   Do you more-or-less cough all day long? Clears throat less freq   Does change of temperature make you cough? Yes,heat   Does laughing or chuckling cause you to cough? yes   Do fumes (perfume, automobile fumes, burned  Toast, etc.,) cause you to cough ?      yes   Does speaking, singing, or talking on the phone cause you to cough   ?  yes   Neurogenic/Airway score      Also severe cough with mint toothpaste  rec No mint at all  Of any type Increase neurontin to 300 mg three times a day  Change omeprazole to 30 min before supper Prednisone 10 mg take  4 each am x 2 days,   2 each am x 2 days,  1 each am x 2 days and stop  Take delsym two tsp every 12 hours and supplement if needed with  tramadol 50 mg up to 2 every 4 hours to suppress the urge to cough.   11/30/2013 f/u ov/[name] re: still on neurontin 300 mg bid / could not tol the 300 tid dose but did help alot Chief Complaint  Patient presents with  . Follow-up    Nonprod cough is present but improved since last visit.   was 100% elimin x 5 days on tramadol and high dose neurontin then p reducing the tramadol started back with occ dry cough - no particular time cough flares but rarely at hs.  Not using 1st gen h1 x at hs. Using no more than 2 tramadol per day. rec No change in medications for now but try to use less tramadol and if needed more chlortrimeton 4 mg take 2 at time and every 4 hours during the day for cough throat tickle, choking, drainage   01/11/2014 f/u ov/[name] re: chronic cough on chlortrimeton / neurontin  300 bid omeprazole q am / pepcid Chief Complaint  Patient presents with  . Follow-up    Pt states that cough have been worse since she developed a cold approx 3 days ago. Cough is non prod.     cough was better and only rare choking / vomiting with new  baseline  =   first thing in am but does not wake her prematurely and no longer using any tramadol or delsym,  Then acutely ill x 3 days much worse than baseline in setting of apparent uri with nasal congestion/ scratchy throat but still no excess or purulent sputum, sob, cp or fever   rec Pantoprazole (protonix) 40 mg   Take 30-60 min before first meal of the day and Pepcid 20 mg one bedtime until return to office - this is the best way to tell whether stomach acid is contributing to your problem.  Prednisone 10 mg take  4 each am x 2 days,   2 each am x 2 days,  1 each am x 2 days and stop  For cough > delsym/tessilon as needed GERD diet   03/01/2014 f/u ov/[name] re: cough x 10 y on neurontin 300 1-2 x daiy Chief Complaint  Patient presents with  . Follow-up    Pt states that her cough is unchnaged. No new co's today.    Cough is sporadic / better while sleeping, no more gag and vomit - " better than it's been in years" but still present, no longer using tramadol  Tessalon not really helping though nor is delsym.  rec Please see patient coordinator before you leave today  to schedule methacholine challenge>never done  In the meantime rec a trial of singulair (montelukast) 10 mg one each pm pending the results of the test      04/12/2014 f/u ov/[name] re: cough x 10 year neurontin 300 once or twice daily [name] / chloretrimeton 4 mg and 2 at hs "the best it's ever beenAdvertising account planner Complaint  Patient presents with  . Follow-up    pt  has non productive cough, pt denies wheezining,chest tightness, sob.  cough is completely sporadic, unpredictable/ much worse with uri now resolved but coughing fits at least once daily, never noct  rec Add  neurontin 100 mg up to 300 mg three times if you need = 900 mg maximum per day   07/19/2014 f/u ov/Juli Odom re: cough x 10 years improved on gabapentin 300 mg bid / singlair/ gerd rx  Chief Complaint  Patient presents with  . Follow-up    Pt states that her cough is about the same. No new co's today.   still has one coughing fit per day but less intense / no pattern as to when  Not using any tramadol at all. rec Increase gabapentin to 300 three times a day    11/17/2014 f/u ov/Kerie Badger re: relapse cough really present since 1998  - baseline cough was 2 x per month bad attacks and daily some throat clearing then two ET's last one June 2016 then cough much worse   Chief Complaint  Patient presents with  . Follow-up    Pt c/o prod cough with clear mucus, slight chest congestion/tightness. States she chokes on a daily basis with most meals and during sleep.    at baseline on gabapentin 300 tid and  h1 in am 2 hs/ singulair /h2 hs / cough remains dry and hacking to point of gag/vomit dayt >> noct   No obvious  day to day or daytime variabilty or assoc sob (if not coughing) or cp or chest tightness, subjective wheeze overt sinus or hb symptoms. No unusual exp hx or h/o childhood pna/ asthma or knowledge of premature birth.  Sleeping ok without nocturnal  or early am exacerbation  of respiratory  c/o's or need for noct saba. Also denies any obvious fluctuation of symptoms with weather or environmental changes or other aggravating or alleviating factors except as outlined above   Current Medications, Allergies, Complete Past Medical History, Past Surgical History, Family History, and Social History were reviewed in [employer] record.  ROS  The following are not active complaints unless bolded sore throat, dysphagia, dental problems, itching, sneezing,  nasal congestion or excess/ purulent secretions, ear ache,   fever, chills, sweats, unintended wt loss, pleuritic or exertional cp,  hemoptysis,  orthopnea pnd or leg swelling, presyncope, palpitations, heartburn, abdominal pain, anorexia, nausea, vomiting, diarrhea  or change in bowel or urinary habits, change in stools or urine, dysuria,hematuria,  rash, arthralgias, visual complaints, headache, numbness weakness or ataxia or problems with walking or coordination,  change in mood/affect or memory.              Objective:  Physical Exam  amb wf nad   11/13/2013         128  > 11/30/2013  129 > 01/11/2014  129 > 03/01/2014 132> 04/12/2014  131 > 07/19/2014  133 >  11/17/2014  131  Wt Readings from Last 3 Encounters:  11/02/13 125 lb (56.7 kg)  10/19/13 126 lb (57.153 kg)  04/21/12 124 lb 9.6 oz (56.518 kg)      HEENT: nl dentition, turbinates, and orophanx which remains  pristine. Nl external ear canals without cough reflex   NECK :  without JVD/Nodes/TM/ nl carotid upstrokes bilaterally   LUNGS: no acc muscle use, clear to A and P bilaterally with  cough early on insp  maneuvers   CV:  RRR  no s3 or murmur or increase in P2, no edema   ABD:  soft and nontender with nl excursion in the supine position. No bruits or organomegaly, bowel sounds nl  MS:  warm without deformities, calf tenderness, cyanosis or clubbing         Assessment & Plan:

## 2014-11-17 NOTE — Patient Instructions (Signed)
The key to effective treatment for your cough is eliminating the non-stop cycle of cough you're stuck in long enough to let your airway heal completely and then see if there is anything still making you cough once you stop the cough suppression, but this should take no more than 5 days to figure out  First take delsym two tsp every 12 hours and supplement if needed with  tramadol 50 mg up to 2 every 4 hours to suppress the urge to cough at all or even clear your throat. Swallowing water or using ice chips/non mint and menthol containing candies (such as lifesavers or sugarless jolly ranchers) are also effective.  You should rest your voice and avoid activities that you know make you cough.  Once you have eliminated the cough for 3 straight days try reducing the tramadol first,  then the delsym as tolerated.    Prednisone 10 mg take  4 each am x 2 days,   2 each am x 2 days,  1 each am x 2 days and stop (this is to eliminate allergies and inflammation from coughing)  Protonix (pantoprazole) Take 30-60 min before first meal of the day and Pepcid 20 mg one bedtime plus chlorpheniramine 4 mg x 2 at bedtime (both available over the counter)  until cough is completely gone for at least a week without the need for cough suppression  GERD (REFLUX)  is an extremely common cause of respiratory symptoms, many times with no significant heartburn at all.    It can be treated with medication, but also with lifestyle changes including avoidance of late meals, excessive alcohol, smoking cessation, and avoid fatty foods, chocolate, peppermint, colas, red wine, and acidic juices such as orange juice.  NO MINT OR MENTHOL PRODUCTS SO NO COUGH DROPS  USE HARD CANDY INSTEAD (jolley ranchers or Stover's or Lifesavers (all available in sugarless versions) NO OIL BASED VITAMINS - use powdered substitutes.  Once better to your satisfaction go back to previous maintenance problem - if not better call for appt with Dr Joya Gaskins  or trial of elavil

## 2014-11-18 ENCOUNTER — Encounter: Payer: Self-pay | Admitting: Internal Medicine

## 2014-11-18 NOTE — Assessment & Plan Note (Signed)
-   neurontin 100 tid 11/02/2013 >  Increase to 300 tid 11/13/2013 >  11/19/2013 called to report all better but 11/30/2013 reduced neurontin to 300 bid due to cns effects - 07/19/2014 rechallenged with gabapentin 300 tid  - 09/2014 flare p ET   Control was much better until ET typical of   Upper airway cough syndrome, so named because it's frequently impossible to sort out how much is  CR/sinusitis with freq throat clearing (which can be related to primary GERD)   vs  causing  secondary (" extra esophageal")  GERD from wide swings in gastric pressure that occur with throat clearing, often  promoting self use of mint and menthol lozenges that reduce the lower esophageal sphincter tone and exacerbate the problem further in a cyclical fashion.   These are the same pts (now being labeled as having "irritable larynx syndrome" by some cough centers) who not infrequently have a history of having failed to tolerate ace inhibitors,  dry powder inhalers or biphosphonates or report having atypical reflux symptoms that don't respond to standard doses of PPI , and are easily confused as having aecopd or asthma flares by even experienced allergists/ pulmonologists.   rec max cough suppression then resume prev maint rx and if not better elavil at hs or refer to voice center at [facility]  I had an extended discussion with the patient reviewing all relevant studies completed to date and  lasting 15 to 20 minutes of a 25 minute visit    Each maintenance medication was reviewed in detail including most importantly the difference between maintenance and prns and under what circumstances the prns are to be triggered using an action plan format that is not reflected in the computer generated alphabetically organized AVS.    Please see instructions for details which were reviewed in writing and the patient given a copy highlighting the part that I personally wrote and discussed at today's ov.

## 2014-11-22 ENCOUNTER — Telehealth: Payer: Self-pay | Admitting: Internal Medicine

## 2014-11-22 NOTE — Telephone Encounter (Signed)
Spoke with pt, aware of MW's recs.  Nothing further needed.  

## 2014-11-22 NOTE — Telephone Encounter (Signed)
Called spoke with pt. She reports she had some left over tramadol from past surgery on took this on 11/17/14. The next day her cough was almost completely gone. She still has only been taking the tramadol and did not get the other medications filled that MW had prescribed. She is no longer choking when she eats or in the mornings. She wants to know if she can still only take the tramadol or does he wants her to fill her other medications. Please advise MW thanks

## 2014-11-22 NOTE — Telephone Encounter (Signed)
[id number] x1 

## 2014-11-22 NOTE — Telephone Encounter (Signed)
[phone] Pt cb

## 2014-11-22 NOTE — Telephone Encounter (Signed)
Tramadol just covers up the cough but if gone now to her satisfaction off tramadol she can continue prev rx

## 2014-12-09 DIAGNOSIS — H3320 Serous retinal detachment, unspecified eye: Secondary | ICD-10-CM | POA: Insufficient documentation

## 2014-12-28 ENCOUNTER — Other Ambulatory Visit: Payer: Self-pay | Admitting: Internal Medicine

## 2014-12-29 NOTE — Telephone Encounter (Signed)
Pt requesting refill on Tramadol. Last filled 11/17/14 for # 40 tablets Please advise if ok to refill

## 2015-01-05 ENCOUNTER — Ambulatory Visit (INDEPENDENT_AMBULATORY_CARE_PROVIDER_SITE_OTHER): Payer: No Typology Code available for payment source | Admitting: Internal Medicine

## 2015-01-05 ENCOUNTER — Encounter: Payer: Self-pay | Admitting: Internal Medicine

## 2015-01-05 VITALS — BP 118/70 | HR 81 | Ht 65.0 in | Wt 129.2 lb

## 2015-01-05 DIAGNOSIS — R058 Other specified cough: Secondary | ICD-10-CM

## 2015-01-05 DIAGNOSIS — R05 Cough: Secondary | ICD-10-CM | POA: Diagnosis not present

## 2015-01-05 MED ORDER — TRAMADOL HCL 50 MG PO TABS: 50.0000 mg | ORAL_TABLET | Freq: Two times a day (BID) | ORAL | Status: DC

## 2015-01-05 MED ORDER — AMITRIPTYLINE HCL 10 MG PO TABS: 10.0000 mg | ORAL_TABLET | Freq: Every day | ORAL | Status: DC

## 2015-01-05 NOTE — Patient Instructions (Addendum)
First take delsym two tsp every 12 hours and supplement if needed with  tramadol 50 mg up to 2 every 4 hours to suppress the urge to cough at all or even clear your throat. Swallowing water or using ice chips/non mint and menthol containing candies (such as lifesavers or sugarless jolly ranchers) are also effective.  You should rest your voice and avoid activities that you know make you cough.  Once you have eliminated the cough for 3 straight days try reducing the tramadol first,  then the delsym as tolerated.    Protonix (pantoprazole) Take 30-60 min before first meal of the day and Pepcid 20 mg one bedtime plus chlorpheniramine 4 mg x 2 at bedtime (both available over the counter)  until cough is completely gone for at least a week without the need for cough suppression  Gabapentin 300 mg increase to three times daily = 900 mg total per day   Add elavil 10 mg at bedtime    Please schedule a follow up visit in 3 months but call sooner if needed with all meds in hand or let me refer you to Dr Joya Gaskins at [facility]

## 2015-01-05 NOTE — Progress Notes (Signed)
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      History of Present Illness  11/02/2013 1st [facility] office visit/ Wert  Chief Complaint  Patient presents with  . Pulmonary Consult    Self referral. Pt c/o cough x 5 yrs.   Cough is non prod, sometimes until the point of vomiting.  Worse while eating and is triggered by certain smells.   onset was insidious with lots of throat clearing, pattern has been persistent x 5 y  [facility]/ ent dx was acid reflux rx x years and no better. [facility] w/u also unhelpful, does not remember what dept but sent for speech therapy and failed to keep appt. Cough is dry more than wet, day >  Night, chokes when eats lunch, p smells like at work. Rarely does gets sob unless coughing maybe once a month with clogging, better p saba  eia in hs only soccer and cheerleading Allergy testing was neg "on [address] " Started taking h1 and h2 can't really tell much yet  rec Pantoprazole (protonix) 40 mg   Take 30-60 min before first meal of the day and Pepcid 20 mg one bedtime until return to office and chlortrimeton 4 mg at bedtime GERD diet . Gabapentin 100 mg three times a day  Schedule an appt in 2 weeks at your convenience when we can do heavy cough suppression.     11/13/2013 f/u ov/Wert re: chronic cough x 5y daily  Chief Complaint  Patient presents with  . Follow-up    Pt states that slightly improved. She was able to eat a meal yesterday w/o any cough.  No new co's today.       Kouffman Reflux v Neurogenic Cough Differentiator Reflux Comments  Do you awaken from a sound sleep coughing violently?                            With trouble breathing? x1 since ov   Do you have choking episodes when you cannot  Get enough air, gasping for air ?               Yes   Do you usually cough when you lie down into  The bed, or when you just lie down to rest ?                          no   Do you usually cough after meals or eating?         definitely   Do you cough when (or after) you bend over?    Not now   GERD SCORE     Kouffman Reflux v Neurogenic Cough Differentiator Neurogenic   Do you more-or-less cough all day long? Clears throat less freq   Does change of temperature make you cough? Yes,heat   Does laughing or chuckling cause you to cough? yes   Do fumes (perfume, automobile fumes, burned  Toast, etc.,) cause you to cough ?      yes   Does speaking, singing, or talking on the phone cause you to cough   ?  yes   Neurogenic/Airway score      Also severe cough with mint toothpaste  rec No mint at all  Of any type Increase neurontin to 300 mg three times a day  Change omeprazole to 30 min before supper Prednisone 10 mg take  4 each am x 2 days,   2 each am x 2 days,  1 each am x 2 days and stop  Take delsym two tsp every 12 hours and supplement if needed with  tramadol 50 mg up to 2 every 4 hours to suppress the urge to cough.   11/30/2013 f/u ov/Wert re: still on neurontin 300 mg bid / could not tol the 300 tid dose but did help alot Chief Complaint  Patient presents with  . Follow-up    Nonprod cough is present but improved since last visit.   was 100% elimin x 5 days on tramadol and high dose neurontin then p reducing the tramadol started back with occ dry cough - no particular time cough flares but rarely at hs.  Not using 1st gen h1 x at hs. Using no more than 2 tramadol per day. rec No change in medications for now but try to use less tramadol and if needed more chlortrimeton 4 mg take 2 at time and every 4 hours during the day for cough throat tickle, choking, drainage   01/11/2014 f/u ov/Wert re: chronic cough on chlortrimeton / neurontin 300 bid omeprazole q am / pepcid Chief Complaint  Patient presents with    . Follow-up    Pt states that cough have been worse since she developed a cold approx 3 days ago. Cough is non prod.     cough was better and only rare choking / vomiting with new  baseline  =   first thing in am but does not wake her prematurely and no longer using any tramadol or delsym,  Then acutely ill x 3 days much worse than baseline in setting of apparent uri with nasal congestion/ scratchy throat but still no excess or purulent sputum, sob, cp or fever   rec Pantoprazole (protonix) 40 mg   Take 30-60 min before first meal of the day and Pepcid 20 mg one bedtime until return to office - this is the best way to tell whether stomach acid is contributing to your problem.  Prednisone 10 mg take  4 each am x 2 days,   2 each am x 2 days,  1 each am x 2 days and stop  For cough > delsym/tessilon as needed GERD diet   03/01/2014 f/u ov/Wert re: cough x 10 y on neurontin 300 1-2 x daiy Chief Complaint  Patient presents with  . Follow-up    Pt states that her cough is unchnaged. No new co's today.    Cough is sporadic / better while sleeping, no more gag and vomit - " better than it's been in years" but still present, no longer using tramadol  Tessalon not really helping though nor is delsym.  rec Please see patient coordinator before you leave today  to schedule methacholine challenge>never done  In the meantime rec a trial of singulair (montelukast) 10 mg one each pm pending the results of the test      04/12/2014 f/u ov/Wert re: cough x 10 year neurontin 300 once or twice daily [name] / chloretrimeton 4 mg and 2 at hs "the best it's ever beenAdvertising account planner Complaint  Patient presents with  . Follow-up  pt has non productive cough, pt denies wheezining,chest tightness, sob.  cough is completely sporadic, unpredictable/ much worse with uri now resolved but coughing fits at least once daily, never noct  rec Add neurontin 100 mg up to 300 mg three times if you need = 900 mg maximum per  day   07/19/2014 f/u ov/Wert re: cough x 10 years improved on gabapentin 300 mg bid / singlair/ gerd rx  Chief Complaint  Patient presents with  . Follow-up    Pt states that her cough is about the same. No new co's today.   still has one coughing fit per day but less intense / no pattern as to when  Not using any tramadol at all. rec Increase gabapentin to 300 three times a day    11/17/2014 f/u ov/Wert re: relapse cough really present since 1998  - baseline cough was 2 x per month bad attacks and daily some throat clearing then two ET's last one June 2016 then cough much worse   Chief Complaint  Patient presents with  . Follow-up    Pt c/o prod cough with clear mucus, slight chest congestion/tightness. States she chokes on a daily basis with most meals and during sleep.    at baseline on gabapentin 300 tid and  h1 in am 2 hs/ singulair /h2 hs / cough remains dry and hacking to point of gag/vomit dayt >> noct rec The key to effective treatment for your cough is eliminating the non-stop cycle of cough you're stuck in long enough to let your airway heal completely and then see if there is anything still making you cough once you stop the cough suppression, but this should take no more than 5 days to figure out First take delsym two tsp every 12 hours and supplement if needed with  tramadol 50 mg up to 2 every 4 hours to suppress the urge to cough at all or even clear your throat. Swallowing water or using ice chips/non mint and menthol containing candies (such as lifesavers or sugarless jolly ranchers) are also effective.  You should rest your voice and avoid activities that you know make you cough. Once you have eliminated the cough for 3 straight days try reducing the tramadol first,  then the delsym as tolerated.   Prednisone 10 mg take  4 each am x 2 days,   2 each am x 2 days,  1 each am x 2 days and stop (this is to eliminate allergies and inflammation from coughing) Protonix  (pantoprazole) Take 30-60 min before first meal of the day and Pepcid 20 mg one bedtime plus chlorpheniramine 4 mg x 2 at bedtime (both available over the counter)  until cough is completely gone for at least a week without the need for cough suppression GERD (REFLUX)  is an extremely common cause of respiratory symptoms, many times with no significant heartburn at all.  Once better to your satisfaction go back to previous maintenance problem - if not better call for appt with Dr Joya Gaskins or trial of elavil    01/05/2015  f/u ov/Wert re: recurrent cough x 10 y duration when not taking tramadol  Bid/ only using gabapentin twice daily Chief Complaint  Patient presents with  . Acute Visit    Choking feeling never went away completely and is back now - Starts with dry cough that leads to choking feeling with occas vomiting     No obvious  day to day or daytime variabilty or assoc sob (  if not coughing) or cp or chest tightness, subjective wheeze overt sinus or hb symptoms. No unusual exp hx or h/o childhood pna/ asthma or knowledge of premature birth.  Sleeping ok without nocturnal  or early am exacerbation  of respiratory  c/o's or need for noct saba. Also denies any obvious fluctuation of symptoms with weather or environmental changes or other aggravating or alleviating factors except as outlined above   Current Medications, Allergies, Complete Past Medical History, Past Surgical History, Family History, and Social History were reviewed in [employer] record.  ROS  The following are not active complaints unless bolded sore throat, dysphagia, dental problems, itching, sneezing,  nasal congestion or excess/ purulent secretions, ear ache,   fever, chills, sweats, unintended wt loss, pleuritic or exertional cp, hemoptysis,  orthopnea pnd or leg swelling, presyncope, palpitations, heartburn, abdominal pain, anorexia, nausea, vomiting, diarrhea  or change in bowel or urinary habits,  change in stools or urine, dysuria,hematuria,  rash, arthralgias, visual complaints, headache, numbness weakness or ataxia or problems with walking or coordination,  change in mood/affect or memory.              Objective:  Physical Exam  amb wf nad   11/13/2013         128  > 11/30/2013  129 > 01/11/2014  129 > 03/01/2014 132> 04/12/2014  131 > 07/19/2014  133 >  11/17/2014  131 > 01/05/2015 129  Wt Readings from Last 3 Encounters:  11/02/13 125 lb (56.7 kg)  10/19/13 126 lb (57.153 kg)  04/21/12 124 lb 9.6 oz (56.518 kg)      HEENT: nl dentition, turbinates, and oropharynx  which remains  pristine. Nl external ear canals without cough reflex   NECK :  without JVD/Nodes/TM/ nl carotid upstrokes bilaterally   LUNGS: no acc muscle use, clear to A and P bilaterally with  No cough on insp or exp    CV:  RRR  no s3 or murmur or increase in P2, no edema   ABD:  soft and nontender with nl excursion in the supine position. No bruits or organomegaly, bowel sounds nl  MS:  warm without deformities, calf tenderness, cyanosis or clubbing            Assessment & Plan:

## 2015-01-07 NOTE — Assessment & Plan Note (Addendum)
-   neg ent eval in [facility], no resp to rx at [facility]/ Allergy testing was neg "on [address] " - neurontin 100 tid 11/02/2013 >  Increase to 300 tid 11/13/2013 >  11/19/2013 called to report all better but 11/30/2013 reduced neurontin to 300 bid due to cns effects - 07/19/2014 rechallenged with gabapentin 300 tid > cough much better, off tramadol  - 09/2014 flare p ET  - Added qhs elavil 01/05/2015    Cough remains tramadol dependent and not clear she's consistent with a complex maint rx that includes singulair, gerd rx, 1st gen h1 and neg resp to pred in past so very strongly doubt allergy/asthma  I had an extended discussion with the patient reviewing all relevant studies completed to date and  lasting 15 to 20 minutes of a 25 minute visit    rec add trial of elavil at hs per guidelines and titrate as high as anticholinergic side effects will allow > refer to [facility] next step   Each maintenance medication was reviewed in detail including most importantly the difference between maintenance and prns and under what circumstances the prns are to be triggered using an action plan format that is not reflected in the computer generated alphabetically organized AVS.    Please see instructions for details which were reviewed in writing and the patient given a copy highlighting the part that I personally wrote and discussed at today's ov.

## 2015-01-26 ENCOUNTER — Telehealth: Payer: Self-pay | Admitting: Internal Medicine

## 2015-01-26 NOTE — Telephone Encounter (Signed)
lmtcb X1 for pt  

## 2015-01-26 NOTE — Telephone Encounter (Signed)
Pt returned call and can be reached @ same #.Debra Cain

## 2015-01-26 NOTE — Telephone Encounter (Signed)
Called and spoke with pt. Reviewed MW's recs. I offered her an ov on 01/27/15 but pt refused stating she was busy at work and she would be [place] of town for [place] this [place]. I scheduled her for ov on 01/31/15 @ 3:15 with MW. Pt voiced understanding and agreed to ov. She had no further questions. Nothing further needed.

## 2015-01-26 NOTE — Telephone Encounter (Signed)
advil cold and sinus is all we can recommend over the phone, way too complicated to try to more s ov asap with all active meds in hand

## 2015-01-26 NOTE — Telephone Encounter (Signed)
Spoke with pt, c/o sinus congestion, PND, mostly nonprod cough, fatigue X2 days.  Denies fever, chest pain.   Pt not taking any meds at this time for s/s.   Pt uses [facility].    MW please advise on recs. Thanks!

## 2015-01-31 ENCOUNTER — Ambulatory Visit (INDEPENDENT_AMBULATORY_CARE_PROVIDER_SITE_OTHER): Payer: No Typology Code available for payment source | Admitting: Internal Medicine

## 2015-01-31 ENCOUNTER — Encounter: Payer: Self-pay | Admitting: Internal Medicine

## 2015-01-31 VITALS — BP 124/80 | HR 106 | Ht 65.0 in | Wt 132.6 lb

## 2015-01-31 DIAGNOSIS — R05 Cough: Secondary | ICD-10-CM | POA: Diagnosis not present

## 2015-01-31 DIAGNOSIS — R058 Other specified cough: Secondary | ICD-10-CM

## 2015-01-31 MED ORDER — ACETAMINOPHEN-CODEINE #3 300-30 MG PO TABS: ORAL_TABLET | ORAL | Status: DC

## 2015-01-31 MED ORDER — FLUTTER DEVI: Status: DC

## 2015-01-31 MED ORDER — AZITHROMYCIN 250 MG PO TABS: ORAL_TABLET | ORAL | Status: DC

## 2015-01-31 MED ORDER — PREDNISONE 10 MG PO TABS: ORAL_TABLET | ORAL | Status: DC

## 2015-01-31 NOTE — Progress Notes (Signed)
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      History of Present Illness  11/02/2013 1st [facility] office visit/ Debra Cain  Chief Complaint  Patient presents with  . Pulmonary Consult    Self referral. Pt c/o cough x 5 yrs.   Cough is non prod, sometimes until the point of vomiting.  Worse while eating and is triggered by certain smells.   onset was insidious with lots of throat clearing, pattern has been persistent x 5 y  [facility]/ ent dx was acid reflux rx x years and no better. [facility] w/u also unhelpful, does not remember what dept but sent for speech therapy and failed to keep appt. Cough is dry more than wet, day >  Night, chokes when eats lunch, p smells like at work. Rarely does gets sob unless coughing maybe once a month with clogging, better p saba  eia in hs only soccer and cheerleading Allergy testing was neg "on [address] " Started taking h1 and h2 can't really tell much yet  rec Pantoprazole (protonix) 40 mg   Take 30-60 min before first meal of the day and Pepcid 20 mg one bedtime until return to office and chlortrimeton 4 mg at bedtime GERD diet . Gabapentin 100 mg three times a day  Schedule an appt in 2 weeks at your convenience when we can do heavy cough suppression.     11/13/2013 f/u ov/Debra Cain re: chronic cough x 5y daily  Chief Complaint  Patient presents with  . Follow-up    Pt states that slightly improved. She was able to eat a meal yesterday w/o any cough.  No new co's today.       Kouffman Reflux v Neurogenic Cough Differentiator Reflux Comments  Do you awaken from a sound sleep coughing violently?                            With trouble breathing? x1 since ov   Do you have choking episodes when you cannot  Get enough air, gasping for air ?               Yes   Do you usually cough when you lie down into  The bed, or when you just lie down to rest ?                          no   Do you usually cough after meals or eating?         definitely   Do you cough when (or after) you bend over?    Not now   GERD SCORE     Kouffman Reflux v Neurogenic Cough Differentiator Neurogenic   Do you more-or-less cough all day long? Clears throat less freq   Does change of temperature make you cough? Yes,heat   Does laughing or chuckling cause you to cough? yes   Do fumes (perfume, automobile fumes, burned  Toast, etc.,) cause you to cough ?      yes   Does speaking, singing, or talking on the phone cause you to cough   ?  yes   Neurogenic/Airway score      Also severe cough with mint toothpaste  rec No mint at all  Of any type Increase neurontin to 300 mg three times a day  Change omeprazole to 30 min before supper Prednisone 10 mg take  4 each am x 2 days,   2 each am x 2 days,  1 each am x 2 days and stop  Take delsym two tsp every 12 hours and supplement if needed with  tramadol 50 mg up to 2 every 4 hours to suppress the urge to cough.   11/30/2013 f/u ov/Debra Cain re: still on neurontin 300 mg bid / could not tol the 300 tid dose but did help alot Chief Complaint  Patient presents with  . Follow-up    Nonprod cough is present but improved since last visit.   was 100% elimin x 5 days on tramadol and high dose neurontin then p reducing the tramadol started back with occ dry cough - no particular time cough flares but rarely at hs.  Not using 1st gen h1 x at hs. Using no more than 2 tramadol per day. rec No change in medications for now but try to use less tramadol and if needed more chlortrimeton 4 mg take 2 at time and every 4 hours during the day for cough throat tickle, choking, drainage   01/11/2014 f/u ov/Debra Cain re: chronic cough on chlortrimeton / neurontin 300 bid omeprazole q am / pepcid Chief Complaint  Patient presents with    . Follow-up    Pt states that cough have been worse since she developed a cold approx 3 days ago. Cough is non prod.     cough was better and only rare choking / vomiting with new  baseline  =   first thing in am but does not wake her prematurely and no longer using any tramadol or delsym,  Then acutely ill x 3 days much worse than baseline in setting of apparent uri with nasal congestion/ scratchy throat but still no excess or purulent sputum, sob, cp or fever   rec Pantoprazole (protonix) 40 mg   Take 30-60 min before first meal of the day and Pepcid 20 mg one bedtime until return to office - this is the best way to tell whether stomach acid is contributing to your problem.  Prednisone 10 mg take  4 each am x 2 days,   2 each am x 2 days,  1 each am x 2 days and stop  For cough > delsym/tessilon as needed GERD diet   03/01/2014 f/u ov/Debra Cain re: cough x 10 y on neurontin 300 1-2 x daiy Chief Complaint  Patient presents with  . Follow-up    Pt states that her cough is unchnaged. No new co's today.    Cough is sporadic / better while sleeping, no more gag and vomit - " better than it's been in years" but still present, no longer using tramadol  Tessalon not really helping though nor is delsym.  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 Follow-up  pt has non productive cough, pt denies wheezining,chest tightness, sob.  cough is completely sporadic, unpredictable/ much worse with uri now resolved but coughing fits at least once daily, never noct  rec Add neurontin 100 mg up to 300 mg three times if you need = 900 mg maximum per  day   07/19/2014 f/u ov/Debra Cain re: cough x 10 years improved on gabapentin 300 mg bid / singlair/ gerd rx  Chief Complaint  Patient presents with  . Follow-up    Pt states that her cough is about the same. No new co's today.   still has one coughing fit per day but less intense / no pattern as to when  Not using any tramadol at all. rec Increase gabapentin to 300 three times a day    11/17/2014 f/u ov/Phillippe Orlick re: relapse cough really present since 1998  - baseline cough was 2 x per month bad attacks and daily some throat clearing then two ET's last one June 2016 then cough much worse   Chief Complaint  Patient presents with  . Follow-up    Pt c/o prod cough with clear mucus, slight chest congestion/tightness. States she chokes on a daily basis with most meals and during sleep.    at baseline on gabapentin 300 tid and  h1 in am 2 hs/ singulair /h2 hs / cough remains dry and hacking to point of gag/vomit dayt >> noct rec The key to effective treatment for your cough is eliminating the non-stop cycle of cough you're stuck in long enough to let your airway heal completely and then see if there is anything still making you cough once you stop the cough suppression, but this should take no more than 5 days to figure out First take delsym two tsp every 12 hours and supplement if needed with  tramadol 50 mg up to 2 every 4 hours to suppress the urge to cough at all or even clear your throat. Swallowing water or using ice chips/non mint and menthol containing candies (such as lifesavers or sugarless jolly ranchers) are also effective.  You should rest your voice and avoid activities that you know make you cough. Once you have eliminated the cough for 3 straight days try reducing the tramadol first,  then the delsym as tolerated.   Prednisone 10 mg take  4 each am x 2 days,   2 each am x 2 days,  1 each am x 2 days and stop (this is to eliminate allergies and inflammation from coughing) Protonix  (pantoprazole) Take 30-60 min before first meal of the day and Pepcid 20 mg one bedtime plus chlorpheniramine 4 mg x 2 at bedtime (both available over the counter)  until cough is completely gone for at least a week without the need for cough suppression GERD (REFLUX)  is an extremely common cause of respiratory symptoms, many times with no significant heartburn at all.  Once better to your satisfaction go back to previous maintenance problem - if not better call for appt with Dr Joya Gaskins or trial of elavil    01/05/2015  f/u ov/Temitayo Covalt re: recurrent cough x 10 y duration when not taking tramadol  Bid/ only using gabapentin twice daily Chief Complaint  Patient presents with  . Acute Visit    Choking feeling never went away completely and is back now - Starts with dry cough that leads to choking feeling with occas vomiting  rec First take delsym two tsp every 12 hours and supplement if needed with  tramadol 50 mg up to 2 every 4 hours   Once you have eliminated the cough for 3 straight days try reducing the tramadol first,  then the delsym as tolerated.   Protonix (pantoprazole) Take 30-60 min before first meal of the day and Pepcid 20 mg one bedtime plus chlorpheniramine 4 mg x 2 at bedtime (both available over the counter)  until cough is completely gone for at least a week without the need for cough suppression Gabapentin 300 mg increase to three times daily = 900 mg total per day  Add elavil 10 mg at bedtime      01/31/2015  f/u ov/Holly Iannaccone re: cough x 10 y  Chief Complaint  Patient presents with  . Acute Visit    Pt c/o productive cough, wheezing, sinus congestion, with mild fever x 10 days.   cough is worse day than noct takes tramadol 50 mg one / 2 chlortrimeton at hs and sleeps fine s premature awakening. Dry eye much better off tramadol/ says can't tol lights as "my left pupil won't constrict" says eye doctors knows her meds (multiple anticholinergics)        No obvious  day to day or  daytime variabilty or assoc sob (if not coughing) or cp or chest tightness, subjective wheeze overt   hb symptoms. No unusual exp hx or h/o childhood pna/ asthma or knowledge of premature birth.  Sleeping ok without nocturnal  or early am exacerbation  of respiratory  c/o's or need for noct saba. Also denies any obvious fluctuation of symptoms with weather or environmental changes or other aggravating or alleviating factors except as outlined above   Current Medications, Allergies, Complete Past Medical History, Past Surgical History, Family History, and Social History were reviewed in [employer] record.  ROS  The following are not active complaints unless bolded sore throat, dysphagia, dental problems, itching, sneezing,  nasal congestion or excess/ purulent secretions, ear ache,   fever, chills, sweats, unintended wt loss, pleuritic or exertional cp, hemoptysis,  orthopnea pnd or leg swelling, presyncope, palpitations, heartburn, abdominal pain, anorexia, nausea, vomiting, diarrhea  or change in bowel or urinary habits, change in stools or urine, dysuria,hematuria,  rash, arthralgias, visual complaints, headache, numbness weakness or ataxia or problems with walking or coordination,  change in mood/affect or memory.              Objective:  Physical Exam  amb wf nad / vital signs reviewed  11/13/2013         128  > 11/30/2013  129 > 01/11/2014  129 > 03/01/2014 132> 04/12/2014  131 > 07/19/2014  133 >  11/17/2014  131 > 01/05/2015 129 > 01/31/2015  133  Wt Readings from Last 3 Encounters:  11/02/13 125 lb (56.7 kg)  10/19/13 126 lb (57.153 kg)  04/21/12 124 lb 9.6 oz (56.518 kg)      HEENT: nl dentition, turbinates, and oropharynx which remains  pristine. Nl external ear canals without cough reflex   NECK :  without JVD/Nodes/TM/ nl carotid upstrokes bilaterally   LUNGS: no acc muscle use, clear to A and P bilaterally with  No cough on insp or exp    CV:  RRR  no  s3 or murmur or increase in P2, no edema   ABD:  soft and nontender with nl excursion in the supine position. No bruits or organomegaly, bowel sounds nl  MS:  warm without deformities, calf tenderness, cyanosis or clubbing  Assessment & Plan:   Outpatient Encounter Prescriptions as of 01/31/2015  Medication Sig  . amitriptyline (ELAVIL) 10 MG tablet Take 1 tablet (10 mg total) by mouth at bedtime.  . Biotin 5000 MCG CAPS Take 10,000 mcg by mouth daily.  . chlorpheniramine (CHLOR-TRIMETON) 4 MG tablet Take 4-8 mg by mouth 2 (two) times daily. Take 1 tablet (4 mg) every morning and 2 tablets (8 mg) every night  . famotidine (PEPCID) 20 MG tablet Take 20 mg by mouth at bedtime.  . gabapentin (NEURONTIN) 300 MG capsule TAKE 1 CAPSULE BY MOUTH THREE TIMES A DAY -lunch supper and bedtime  . montelukast (SINGULAIR) 10 MG tablet Take 1 tablet (10 mg total) by mouth at bedtime.  . prednisoLONE acetate (PRED FORTE) 1 % ophthalmic suspension Apply to eye.  . sertraline (ZOLOFT) 50 MG tablet Take 50 mg by mouth daily.  . traMADol (ULTRAM) 50 MG tablet Take 1 tablet (50 mg total) by mouth 2 (two) times daily.  Marland Kitchen acetaminophen-codeine (TYLENOL #3) 300-30 MG tablet One every 4 hours as needed for cough  . azithromycin (ZITHROMAX) 250 MG tablet Take 2 on day one then 1 daily x 4 days  . LOTEMAX 0.5 % GEL Instill 1 drop in left eye three times daily  . pantoprazole (PROTONIX) 40 MG tablet Take 1 tablet (40 mg total) by mouth daily. Take 30-60 min before first meal of the day (Patient not taking: Reported on 01/31/2015)  . predniSONE (DELTASONE) 10 MG tablet Take  4 each am x 2 days,   2 each am x 2 days,  1 each am x 2 days and stop  . Respiratory Therapy Supplies (FLUTTER) DEVI Use as directed   No facility-administered encounter medications on file as of 01/31/2015.

## 2015-01-31 NOTE — Patient Instructions (Addendum)
zpak and Prednisone 10 mg take  4 each am x 2 days,   2 each am x 2 days,  1 each am x 2 days and stop   Change tramadol to tylenol #3  1-2 every 4 hours as needed for cough and use the flutter as much as you   Be sure your eye doctor knows you are on chlortrimeton and amitryptilline   Please see patient coordinator before you leave today  to schedule eval by Dr Joya Gaskins at [facility]  ENT

## 2015-02-01 NOTE — Assessment & Plan Note (Signed)
-   neg ent eval in [place], no resp to rx at [facility]/ Allergy testing was neg "on [address] " - neurontin 100 tid 11/02/2013 >  Increase to 300 tid 11/13/2013 >  11/19/2013 called to report all better but 11/30/2013 reduced neurontin to 300 bid due to cns effects - 07/19/2014 rechallenged with gabapentin 300 tid > cough much better, off tramadol  - 09/2014 flare p ET  - Added qhs elavil 01/05/2015 > left at 10 mg daily on 01/31/2015 as reported possible anticholinergic effect on L eye  - Added flutter valve  01/31/2015  - Referred to [place] [facility] 01/31/2015    Concerned about the anticholinergics she is on and that may contribute to her ocular symptoms also reluctant to increase them any further at this point and will notify opthamology re  my concerns as well as refer her on [facility]  I had an extended discussion with the patient reviewing all relevant studies completed to date and  lasting 15 to 20 minutes of a 25 minute visit    Each maintenance medication was reviewed in detail including most importantly the difference between maintenance and prns and under what circumstances the prns are to be triggered using an action plan format that is not reflected in the computer generated alphabetically organized AVS.    Please see instructions for details which were reviewed in writing and the patient given a copy highlighting the part that I personally wrote and discussed at today's ov.

## 2015-02-09 ENCOUNTER — Other Ambulatory Visit: Payer: Self-pay | Admitting: Internal Medicine

## 2015-03-23 ENCOUNTER — Encounter: Payer: Self-pay | Admitting: Internal Medicine

## 2015-03-23 ENCOUNTER — Ambulatory Visit (INDEPENDENT_AMBULATORY_CARE_PROVIDER_SITE_OTHER): Payer: No Typology Code available for payment source | Admitting: Internal Medicine

## 2015-03-23 VITALS — BP 122/82 | HR 93 | Temp 97.9°F | Ht 65.0 in | Wt 127.5 lb

## 2015-03-23 DIAGNOSIS — F419 Anxiety disorder, unspecified: Secondary | ICD-10-CM

## 2015-03-23 DIAGNOSIS — J45991 Cough variant asthma: Secondary | ICD-10-CM | POA: Diagnosis not present

## 2015-03-23 MED ORDER — ALPRAZOLAM 0.25 MG PO TABS: 0.2500 mg | ORAL_TABLET | Freq: Two times a day (BID) | ORAL | Status: DC | PRN

## 2015-03-23 MED ORDER — SERTRALINE HCL 100 MG PO TABS: 100.0000 mg | ORAL_TABLET | Freq: Every day | ORAL | Status: DC

## 2015-03-23 NOTE — Progress Notes (Signed)
Pre visit review using our clinic review tool, if applicable. No additional management support is needed unless otherwise documented below in the visit note. 

## 2015-03-23 NOTE — Progress Notes (Signed)
Subjective:    Patient ID: Debra Cain, female    DOB: Apr 09, 1971, 44 y.o.   MRN: [id number]  Anxiety Presents for follow-up visit. Symptoms include decreased concentration, dizziness, excessive worry, feeling of choking, insomnia, irritability, nervous/anxious behavior, palpitations, panic and restlessness. Patient reports no shortness of breath or suicidal ideas. Symptoms occur constantly. The severity of symptoms is moderate. The symptoms are aggravated by family issues and social activities. The quality of sleep is poor. Nighttime awakenings: several.   Risk factors include a major life event and change in medication. Her past medical history is significant for anxiety/panic attacks. There is no history of arrhythmia, hyperthyroidism or suicide attempts. Past treatments include counseling (CBT), SSRIs and benzodiazephines. The treatment provided moderate relief. Compliance with prior treatments has been good. Compliance with medications is 76-100%.      Past Medical History  Diagnosis Date  . Chronic interstitial cystitis   . Anxiety   . Detached retina 06/2014    left eye  . GERD (gastroesophageal reflux disease)     takes pepcid  . Complication of anesthesia     slow to wake up  . Chronic cough     neuropathic, following with ENT at [facility] for selective vagal nerve blocks (fall 2016)    Review of Systems  Constitutional: Positive for irritability.  Respiratory: Negative for chest tightness and shortness of breath.   Cardiovascular: Positive for palpitations.  Skin:       Itching all over since stopping "asthma" meds  Neurological: Positive for dizziness.  Psychiatric/Behavioral: Positive for sleep disturbance and decreased concentration. Negative for suicidal ideas and self-injury. The patient is nervous/anxious and has insomnia.        Objective:    Physical Exam  Constitutional: She is oriented to person, place, and time. She appears well-developed and well-nourished.  No distress.  Cardiovascular: Normal rate, regular rhythm and normal heart sounds.   No murmur heard. Pulmonary/Chest: Effort normal and breath sounds normal. No respiratory distress.  Musculoskeletal: She exhibits no edema.  Neurological: She is alert and oriented to person, place, and time. She has normal reflexes.  Psychiatric:  Very anxious, pressured speech and labile laugh    BP 122/82 mmHg  Pulse 93  Temp(Src) 97.9 F (36.6 C) (Oral)  Ht 5\' 5"  (1.651 m)  Wt 127 lb 8 oz (57.834 kg)  BMI 21.22 kg/m2  SpO2 97% Wt Readings from Last 3 Encounters:  03/23/15 127 lb 8 oz (57.834 kg)  01/31/15 132 lb 9.6 oz (60.147 kg)  01/05/15 129 lb 3.2 oz (58.605 kg)     Lab Results  Component Value Date   WBC 6.9 06/17/2014   HGB 14.7 06/17/2014   HCT 42.6 06/17/2014   PLT 369 06/17/2014   GLUCOSE 87 10/19/2013   CHOL 166 10/19/2013   TRIG 39.0 10/19/2013   HDL 47.90 10/19/2013   LDLCALC 110* 10/19/2013   ALT 13 10/19/2013   AST 17 10/19/2013   NA 139 10/19/2013   K 4.1 10/19/2013   CL 105 10/19/2013   CREATININE 0.5 10/19/2013   BUN 11 10/19/2013   CO2 26 10/19/2013   TSH 1.69 10/19/2013    No results found.     Assessment & Plan:   Problem List Items Addressed This Visit    Anxiety - Primary    On SNRI since 2011, changed to SSRI low dose sertraline due to weight gain - managed by gyn -  Increase symptoms with panic attacks 03/2012 during holiday, triggered  by family stressors (note panic attack with ER visit at [facility] reviewed in 2013) rx'd low dose xanax as needed 04/2013 - infrequent use of same so removed from med list Recurrent holiday panic attack 02/2015 at spouse's retirement party - exacerbated by medical issues with chronic cough and loss of vision in L eye spring 2016 Will increase sertraline to max dose to help combat worry symptoms and resume prn xanax Also offered counseling and pt denies need for same at this time due to ineffective prior trials       Relevant Medications   sertraline (ZOLOFT) 100 MG tablet   ALPRAZolam (XANAX) 0.25 MG tablet   Cough variant asthma    Chronic symptoms associated with choking sensation. Local evaluation by pulmonary reviewed Has been seen by specialist ENT at [facility] with initiation of vagal nerve block injections to the superior laryngeal nerve bilaterally and reports improvement following same (November 2016 reviewed in care everywhere) Off tramadol, gabapentin, oral steroids, TCA and inhalers -discontinuation of same may be contributing to anxiety, but no resumption of these medications recommended today          Gwendolyn Grant, MD

## 2015-03-23 NOTE — Patient Instructions (Addendum)
It was good to see you today.  We have reviewed your prior records including labs and tests today  Medications reviewed and updated Increase sertraline to maximum daily dose 100 mg every day and resume low-dose Xanax as needed for sudden anxiety or panic attack symptoms No other medication changes recommended at this time.  We will schedule follow-up with your new primary care provider in the next 4-6 weeks to review symptoms and effects of medication change, please call sooner if problems  Generalized Anxiety Disorder Generalized anxiety disorder (GAD) is a mental disorder. It interferes with life functions, including relationships, work, and school. GAD is different from normal anxiety, which everyone experiences at some point in their lives in response to specific life events and activities. Normal anxiety actually helps [place] prepare for and get through these life events and activities. Normal anxiety goes away after the event or activity is over.  GAD causes anxiety that is not necessarily related to specific events or activities. It also causes excess anxiety in proportion to specific events or activities. The anxiety associated with GAD is also difficult to control. GAD can vary from mild to severe. People with severe GAD can have intense waves of anxiety with physical symptoms (panic attacks).  SYMPTOMS The anxiety and worry associated with GAD are difficult to control. This anxiety and worry are related to many life events and activities and also occur more days than not for 6 months or longer. People with GAD also have three or more of the following symptoms (one or more in children):  Restlessness.   Fatigue.  Difficulty concentrating.   Irritability.  Muscle tension.  Difficulty sleeping or unsatisfying sleep. DIAGNOSIS GAD is diagnosed through an assessment by your health care provider. Your health care provider will ask you questions aboutyour mood,physical symptoms, and  events in your life. Your health care provider may ask you about your medical history and use of alcohol or drugs, including prescription medicines. Your health care provider may also do a physical exam and blood tests. Certain medical conditions and the use of certain substances can cause symptoms similar to those associated with GAD. Your health care provider may refer you to a mental health specialist for further evaluation. TREATMENT The following therapies are usually used to treat GAD:   Medication. Antidepressant medication usually is prescribed for long-term daily control. Antianxiety medicines may be added in severe cases, especially when panic attacks occur.   Talk therapy (psychotherapy). Certain types of talk therapy can be helpful in treating GAD by providing support, education, and guidance. A form of talk therapy called cognitive behavioral therapy can teach you healthy ways to think about and react to daily life events and activities.  Stress managementtechniques. These include yoga, meditation, and exercise and can be very helpful when they are practiced regularly. A mental health specialist can help determine which treatment is best for you. Some people see improvement with one therapy. However, other people require a combination of therapies.   This information is not intended to replace advice given to you by your health care provider. Make sure you discuss any questions you have with your health care provider.   Document Released: 07/21/2012 Document Revised: 04/16/2014 Document Reviewed: 07/21/2012 Elsevier Interactive Patient Education [employer].

## 2015-03-23 NOTE — Assessment & Plan Note (Signed)
On SNRI since 2011, changed to SSRI low dose sertraline due to weight gain - managed by gyn -  Increase symptoms with panic attacks 03/2012 during holiday, triggered by family stressors (note panic attack with ER visit at [facility] reviewed in 2013) rx'd low dose xanax as needed 04/2013 - infrequent use of same so removed from med list Recurrent holiday panic attack 02/2015 at spouse's retirement party - exacerbated by medical issues with chronic cough and loss of vision in L eye spring 2016 Will increase sertraline to max dose to help combat worry symptoms and resume prn xanax Also offered counseling and pt denies need for same at this time due to ineffective prior trials

## 2015-03-23 NOTE — Assessment & Plan Note (Signed)
Chronic symptoms associated with choking sensation. Local evaluation by pulmonary reviewed Has been seen by specialist ENT at [facility] with initiation of vagal nerve block injections to the superior laryngeal nerve bilaterally and reports improvement following same (November 2016 reviewed in care everywhere) Off tramadol, gabapentin, oral steroids, TCA and inhalers -discontinuation of same may be contributing to anxiety, but no resumption of these medications recommended today

## 2015-03-28 ENCOUNTER — Ambulatory Visit: Payer: No Typology Code available for payment source | Admitting: Internal Medicine

## 2015-05-02 ENCOUNTER — Ambulatory Visit (INDEPENDENT_AMBULATORY_CARE_PROVIDER_SITE_OTHER): Payer: No Typology Code available for payment source | Admitting: Internal Medicine

## 2015-05-02 ENCOUNTER — Encounter: Payer: Self-pay | Admitting: Internal Medicine

## 2015-05-02 VITALS — BP 130/84 | HR 75 | Temp 97.8°F | Resp 16 | Ht 65.0 in | Wt 128.0 lb

## 2015-05-02 DIAGNOSIS — T17308D Unspecified foreign body in larynx causing other injury, subsequent encounter: Secondary | ICD-10-CM

## 2015-05-02 DIAGNOSIS — H3570 Unspecified separation of retinal layers: Secondary | ICD-10-CM | POA: Diagnosis not present

## 2015-05-02 DIAGNOSIS — H3322 Serous retinal detachment, left eye: Secondary | ICD-10-CM

## 2015-05-02 DIAGNOSIS — F419 Anxiety disorder, unspecified: Secondary | ICD-10-CM

## 2015-05-02 NOTE — Progress Notes (Signed)
Pre visit review using our clinic review tool, if applicable. No additional management support is needed unless otherwise documented below in the visit note. 

## 2015-05-02 NOTE — Progress Notes (Signed)
Subjective:    Patient ID: MAVEN VENCILL, female    DOB: 1970/04/21, 45 y.o.   MRN: [id number]  HPI  She is here to establish with a new pcp.   Anxiety: She is taking her medication daily as prescribed - the zoloft dose was increased from 50 mg to 100 mg daily.  She states the 100 mg is a little too much, so she does not take the medication daily - takes it about 4-5 times a week, which seems to be working well.  She denies any side effects from the medication. She feels her anxiety is well controlled and she is happy with her current dose of medication. She takes xanax only as needed, which is infrequent.   Choking, cough:  She has a long history of choking and coughing that is thought to be related to nerve damage in her throat.  She has been on multiple meds and tried several things that have not helped.  She is seeing a specialist and recently had injections into her throat that helped minimally.  Her sister has the same thing.     Medications and allergies reviewed with patient and updated if appropriate.  Patient Active Problem List   Diagnosis Date Noted  . Cough variant asthma 03/01/2014  . Anxiety 09/24/2011  . Choking 09/24/2011  . Chronic interstitial cystitis     Current Outpatient Prescriptions on File Prior to Visit  Medication Sig Dispense Refill  . ALPRAZolam (XANAX) 0.25 MG tablet Take 1 tablet (0.25 mg total) by mouth 2 (two) times daily as needed for anxiety. 30 tablet 1  . Biotin 5000 MCG CAPS Take 10,000 mcg by mouth daily.    . sertraline (ZOLOFT) 100 MG tablet Take 1 tablet (100 mg total) by mouth daily. 30 tablet 11   No current facility-administered medications on file prior to visit.    Past Medical History  Diagnosis Date  . Chronic interstitial cystitis   . Anxiety   . Detached retina 06/2014    left eye  . GERD (gastroesophageal reflux disease)     takes pepcid  . Complication of anesthesia     slow to wake up  . Chronic cough    neuropathic, following with ENT at [facility] for selective vagal nerve blocks (fall 2016)    Past Surgical History  Procedure Laterality Date  . Appendectomy  07/1996  . Urology surgery  06/23/01  . Eye surgery Bilateral     Lasik   . Vitrectomy 25 gauge with scleral buckle Left 06/17/2014    Procedure: VITRECTOMY 25 GAUGE WITH SCLERAL BUCKLE;  Surgeon: Sherlynn Stalls, MD;  Location: [place];  Service: Ophthalmology;  Laterality: Left;  . Gas/fluid exchange Left 06/17/2014    Procedure: GAS/FLUID EXCHANGE  C3F8;  Surgeon: Sherlynn Stalls, MD;  Location: [place];  Service: Ophthalmology;  Laterality: Left;    Social History   Social History  . Marital Status: Married    Spouse Name: N/A  . Number of Children: N/A  . Years of Education: N/A   Occupational History  . Stylist     Social History Main Topics  . Smoking status: Never Smoker   . Smokeless tobacco: Never Used  . Alcohol Use: No  . Drug Use: No  . Sexual Activity: Not Asked   Other Topics Concern  . 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 Colitis Father     Review of Systems  Constitutional: Negative for fever and chills.  HENT: Negative for trouble swallowing.   Respiratory: Positive for choking. Negative for cough, shortness of breath and wheezing.   Cardiovascular: Negative for chest pain, palpitations and leg swelling.  Gastrointestinal: Negative for nausea.  Neurological: Positive for headaches (occasional). Negative for dizziness and light-headedness.       Objective:   Filed Vitals:   05/02/15 1307  BP: 130/84  Pulse: 75  Temp: 97.8 F (36.6 C)  Resp: 16   Filed Weights   05/02/15 1307  Weight: 128 lb (58.06 kg)   Body mass index is 21.3 kg/(m^2).   Physical Exam Constitutional: Appears well-developed and well-nourished. No distress.  Neck: Neck  supple. No tracheal deviation present. No thyromegaly present.  No carotid bruit. No cervical adenopathy.   Cardiovascular: Normal rate, regular rhythm and normal heart sounds.   No murmur heard.  No edema Pulmonary/Chest: Effort normal and breath sounds normal. No respiratory distress. No wheezes.  Psych: mood and affect normal         Assessment & Plan:   See Problem List for Assessment and Plan of chronic medical problems.   Follow up annually

## 2015-05-02 NOTE — Patient Instructions (Signed)
It was nice to meet you.   All other Health Maintenance issues reviewed.   All recommended immunizations and age-appropriate screenings are up-to-date.  No immunizations administered today.   Medications reviewed and updated.  No changes recommended at this time.  Please followup annually

## 2015-05-02 NOTE — Assessment & Plan Note (Signed)
Following with a specialist Been multiple meds - no effect Thought to be nerve related Last treatment was steroid injections in neck

## 2015-05-02 NOTE — Assessment & Plan Note (Signed)
In remission.

## 2015-05-17 ENCOUNTER — Encounter: Payer: Self-pay | Admitting: Internal Medicine

## 2015-05-17 DIAGNOSIS — R05 Cough: Secondary | ICD-10-CM | POA: Insufficient documentation

## 2015-05-17 DIAGNOSIS — R058 Other specified cough: Secondary | ICD-10-CM | POA: Insufficient documentation

## 2016-04-12 ENCOUNTER — Other Ambulatory Visit: Payer: Self-pay | Admitting: Internal Medicine

## 2016-04-18 DIAGNOSIS — H04122 Dry eye syndrome of left lacrimal gland: Secondary | ICD-10-CM | POA: Diagnosis not present

## 2016-04-18 DIAGNOSIS — H1851 Endothelial corneal dystrophy: Secondary | ICD-10-CM | POA: Diagnosis not present

## 2016-04-18 DIAGNOSIS — H18892 Other specified disorders of cornea, left eye: Secondary | ICD-10-CM | POA: Diagnosis not present

## 2016-05-04 DIAGNOSIS — H35372 Puckering of macula, left eye: Secondary | ICD-10-CM | POA: Diagnosis not present

## 2016-05-04 DIAGNOSIS — H35411 Lattice degeneration of retina, right eye: Secondary | ICD-10-CM | POA: Diagnosis not present

## 2016-05-04 DIAGNOSIS — H33321 Round hole, right eye: Secondary | ICD-10-CM | POA: Diagnosis not present

## 2016-06-01 DIAGNOSIS — H33321 Round hole, right eye: Secondary | ICD-10-CM | POA: Diagnosis not present

## 2016-06-28 ENCOUNTER — Other Ambulatory Visit: Payer: Self-pay | Admitting: Internal Medicine

## 2016-06-29 DIAGNOSIS — H33321 Round hole, right eye: Secondary | ICD-10-CM | POA: Diagnosis not present

## 2016-07-03 ENCOUNTER — Telehealth: Payer: Self-pay | Admitting: Internal Medicine

## 2016-07-03 MED ORDER — SERTRALINE HCL 100 MG PO TABS
100.0000 mg | ORAL_TABLET | Freq: Every day | ORAL | 0 refills | Status: DC
Start: 2016-07-03 — End: 2016-07-16

## 2016-07-03 NOTE — Telephone Encounter (Signed)
Pt went to the pharmacy to pick up a prescription but she was told that she needed an appointment before it could be filled. I scheduled an appointment for her on 07/16/16. That was the first available that she has. Can you send enough to get her through until her appointment? She is completely out. Medication: sertraline (ZOLOFT) 100 MG tablet Pharmacy: [place]

## 2016-07-03 NOTE — Telephone Encounter (Signed)
RX has been sent to [facility] 

## 2016-07-14 NOTE — Patient Instructions (Addendum)
   Medications reviewed and updated.  No changes recommended at this time.  Your prescription(s) have been submitted to your pharmacy. Please take as directed and contact our office if you believe you are having problem(s) with the medication(s).  Please followup in one year for a physical

## 2016-07-14 NOTE — Progress Notes (Signed)
Subjective:    Patient ID: Debra Cain, female    DOB: Feb 27, 1971, 46 y.o.   MRN: [id number]  HPI The patient is here for follow up.  Anxiety:  She is taking the zoloft  50 mg every other day. She knows she needs the medication, but also wants to get off at times and be on the lowest dose.  she self decreased it to her current dose and feels ok.  She wonders if she will need a higher dose in the near future.  She takes the xanax infrequently as needed.    Cough, chronic:  Her and her sister have a chronic cough.  She has seen pulmonary, ENT, GI and have had every test and been on several different medications.  She wonders about botox injections.    She has some concerns about her memory - she feels like she can not remember anything. Her mom is starting to experience signs of dementia and it is on both side of her family.  She does have stress and a lot going on in her life.   Medications and allergies reviewed with patient and updated if appropriate.  Patient Active Problem List   Diagnosis Date Noted  . Upper airway cough syndrome 05/17/2015  . Detached retina 12/09/2014  . Cough variant asthma 03/01/2014  . Anxiety 09/24/2011  . Choking 09/24/2011  . Chronic interstitial cystitis     Current Outpatient Prescriptions on File Prior to Visit  Medication Sig Dispense Refill  . ALPRAZolam (XANAX) 0.25 MG tablet Take 1 tablet (0.25 mg total) by mouth 2 (two) times daily as needed for anxiety. 30 tablet 1  . Biotin 5000 MCG CAPS Take 10,000 mcg by mouth daily.    . sertraline (ZOLOFT) 100 MG tablet Take 1 tablet (100 mg total) by mouth daily. 30 tablet 0   No current facility-administered medications on file prior to visit.     Past Medical History:  Diagnosis Date  . Anxiety   . Chronic cough    neuropathic, following with ENT at [facility] for selective vagal nerve blocks (fall 2016)  . Chronic interstitial cystitis   . Complication of anesthesia    slow to wake up  . Detached  retina 06/2014   left eye  . GERD (gastroesophageal reflux disease)    takes pepcid    Past Surgical History:  Procedure Laterality Date  . APPENDECTOMY  07/1996  . EYE SURGERY Bilateral    Lasik   . GAS/FLUID EXCHANGE Left 06/17/2014   Procedure: GAS/FLUID EXCHANGE  C3F8;  Surgeon: Sherlynn Stalls, MD;  Location: [place];  Service: Ophthalmology;  Laterality: Left;  . Urology surgery  06/23/01  . VITRECTOMY 25 GAUGE WITH SCLERAL BUCKLE Left 06/17/2014   Procedure: VITRECTOMY 25 GAUGE WITH SCLERAL BUCKLE;  Surgeon: Sherlynn Stalls, MD;  Location: [place];  Service: Ophthalmology;  Laterality: Left;    Social History   Social History  . Marital status: Married    Spouse name: N/A  . Number of children: N/A  . Years of education: N/A   Occupational History  . Stylist     Social History Main Topics  . Smoking status: Never Smoker  . Smokeless tobacco: Never Used  . Alcohol use No  . Drug use: No  . Sexual activity: Not on file   Other Topics Concern  . 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 Colitis Father     Review of Systems  Constitutional: Negative for fever.  Respiratory: Negative for shortness of breath.   Cardiovascular: Negative for chest pain and palpitations.  Neurological: Negative for light-headedness and headaches.  Psychiatric/Behavioral: Negative for dysphoric mood. The patient is nervous/anxious.        Objective:   Vitals:   07/16/16 1006  BP: (!) 142/84  Pulse: 72  Resp: 16  Temp: 97.8 F (36.6 C)   Wt Readings from Last 3 Encounters:  07/16/16 131 lb (59.4 kg)  05/02/15 128 lb (58.1 kg)  03/23/15 127 lb 8 oz (57.8 kg)   Body mass index is 21.8 kg/m.   Physical Exam    Constitutional: Appears well-developed and well-nourished. No distress.  HENT:  Head: Normocephalic and  atraumatic.  Neck: Neck supple. No tracheal deviation present. No thyromegaly present.  No cervical lymphadenopathy Cardiovascular: Normal rate, regular rhythm and normal heart sounds.   No murmur heard. No carotid bruit .  No edema Pulmonary/Chest: Effort normal and breath sounds normal. No respiratory distress. No has no wheezes. No rales.  Skin: Skin is warm and dry. Not diaphoretic.  Psychiatric: Normal mood and affect. Behavior is normal.      Assessment & Plan:    See Problem List for Assessment and Plan of chronic medical problems.

## 2016-07-16 ENCOUNTER — Encounter: Payer: Self-pay | Admitting: Internal Medicine

## 2016-07-16 ENCOUNTER — Ambulatory Visit (INDEPENDENT_AMBULATORY_CARE_PROVIDER_SITE_OTHER): Payer: 59 | Admitting: Internal Medicine

## 2016-07-16 VITALS — BP 142/84 | HR 72 | Temp 97.8°F | Resp 16 | Ht 65.0 in | Wt 131.0 lb

## 2016-07-16 DIAGNOSIS — R05 Cough: Secondary | ICD-10-CM

## 2016-07-16 DIAGNOSIS — R413 Other amnesia: Secondary | ICD-10-CM | POA: Diagnosis not present

## 2016-07-16 DIAGNOSIS — R053 Chronic cough: Secondary | ICD-10-CM | POA: Insufficient documentation

## 2016-07-16 DIAGNOSIS — F419 Anxiety disorder, unspecified: Secondary | ICD-10-CM | POA: Diagnosis not present

## 2016-07-16 MED ORDER — ALPRAZOLAM 0.25 MG PO TABS: 0.2500 mg | ORAL_TABLET | Freq: Two times a day (BID) | ORAL | 1 refills | Status: DC | PRN

## 2016-07-16 MED ORDER — SERTRALINE HCL 100 MG PO TABS: 100.0000 mg | ORAL_TABLET | Freq: Every day | ORAL | 3 refills | Status: DC

## 2016-07-16 NOTE — Assessment & Plan Note (Signed)
Overall controlled on 50mg  of zoloft every other day - can adjust if needed - she does not want to be on the medication, but knows she needs it Xanax only as needed - she does not take often meds refilled today

## 2016-07-16 NOTE — Assessment & Plan Note (Signed)
Will look into ENT at [facility] for possible botox injections

## 2016-07-16 NOTE — Progress Notes (Signed)
Pre visit review using our clinic review tool, if applicable. No additional management support is needed unless otherwise documented below in the visit note. 

## 2016-07-16 NOTE — Assessment & Plan Note (Signed)
Unlikely related to early dementia Discussed importance of keeping anxiety well controlled and having a healthy lifestyle Reassured Will monitor for now

## 2016-07-24 ENCOUNTER — Telehealth: Payer: Self-pay | Admitting: Internal Medicine

## 2016-07-24 NOTE — Telephone Encounter (Signed)
Spoke with pt who states she got a call about a cough study and that she is interested in it. Will send the message to Anderson Malta to look into

## 2016-08-07 NOTE — Telephone Encounter (Signed)
Patient aware that I will send this request to Anderson Malta again to contact her regarding the cough study.  Pt aware to contact our office back if not heard anything by the end of week.

## 2016-08-07 NOTE — Telephone Encounter (Signed)
Pt calling again a/b cough study, she can be reached [phone].Debra Cain'

## 2016-08-22 ENCOUNTER — Ambulatory Visit: Payer: 59 | Admitting: Internal Medicine

## 2016-08-24 DIAGNOSIS — H15831 Staphyloma posticum, right eye: Secondary | ICD-10-CM | POA: Diagnosis not present

## 2016-08-24 DIAGNOSIS — H04122 Dry eye syndrome of left lacrimal gland: Secondary | ICD-10-CM | POA: Diagnosis not present

## 2016-08-24 DIAGNOSIS — H35372 Puckering of macula, left eye: Secondary | ICD-10-CM | POA: Diagnosis not present

## 2016-08-28 ENCOUNTER — Ambulatory Visit: Payer: 59 | Admitting: Pulmonary Disease

## 2016-08-28 NOTE — Progress Notes (Signed)
Clinical Research Coordinator / Research RN note : This visit for Subject Debra Cain with DOB: 1970/12/07 on 08/28/2016 was intended to be Screening Visit for Volcano-2 protocol 5.0. Informed consent process was began by this coordinator reviewing the ICF with the subject and discussing inclusion criteria. The subject is a WOCBP. The protocol requires 2 forms of birth control to be used during the study. The subject is currently only using one form of birth control. Subject stated she will speak with her GYN doctor about adding a second form of birth control and will follow-up with this coordinator. Subject stated desire to participate in the study.   Signed by,  Celoron Bing, [place], [place] Coordinator  [facility]  [place], [place] 10:12 AM 08/28/2016

## 2016-08-29 ENCOUNTER — Telehealth: Payer: Self-pay | Admitting: *Deleted

## 2016-08-29 DIAGNOSIS — R058 Other specified cough: Secondary | ICD-10-CM

## 2016-08-29 DIAGNOSIS — R05 Cough: Secondary | ICD-10-CM

## 2016-08-29 NOTE — Telephone Encounter (Signed)
-----   Message from Tanda Rockers, MD sent at 08/29/2016  4:58 PM EDT ----- Order cxr for cough

## 2016-08-29 NOTE — Telephone Encounter (Signed)
Per MW- needs cxr done per cough study Pt aware and order placed

## 2016-08-30 NOTE — Progress Notes (Unsigned)
Late entry clarification:  This subject was present in the [facility] office at the [facility] location on 22/May/2018 for the Screening Visit for the Volcano-2 cough study. The visit began with this coordinator reviewing the ICF version 3.1, dated 09/May/2018, page by page with the subject. When the section regarding pregnancy risks was reviewed the subject stated that she was not currently using birth control except for the use of condoms but was willing to explore the options. At that time this coordinator spoke with Haskell Flirt to review this section of the ICF and provide advice on how to proceed. This coordinator and Doreatha Martin, RN advised the subject that, as mentioned prior, research is voluntary and participating is solely the subjects decision to make. If she desired to explore other birth control options, regardless of the requirements for the study, that again was solely her decision to make. The subject was advised, if so desired, to speak with her GYN to discuss her options because there are risk involved in taking oral birth control, and the decision to begin this medication should be made between her and her doctor as a standard of care decision and what is best for her long term health. The subject stated understanding of the above and stated she would discuss further with her GYN.    At approximately 1530 on 22/May/2018 the subject called the research office and stated she met with her GYN and the decision was made to start an oral contraceptive and that she was picking the medication up from the pharmacy later in the PM of 22/May/2018. I advised the subject I would discuss this with the study team and I would call her back. Subject stated understanding.   At approximately 1700 on 22/May/2018, Dr. Chase Caller reviewed the subjects case and advised for this coordinator to call the subject and ask that she not start the birth control at this time until further discussions can be had regarding  the best way to proceed. This coordinator spoke with the subject at approximately 1715, the subject had not yet taken the medication and was advised to not begin taking the medication until she receives further instructions. Subject stated understanding of the instructions given.   Lyndon Bing, [place], Research Assistant

## 2016-09-10 ENCOUNTER — Ambulatory Visit (INDEPENDENT_AMBULATORY_CARE_PROVIDER_SITE_OTHER)
Admission: RE | Admit: 2016-09-10 | Discharge: 2016-09-10 | Disposition: A | Discharge status: Home / Self Care | Payer: 59 | Source: Ambulatory Visit | Attending: Internal Medicine | Admitting: Internal Medicine

## 2016-09-10 DIAGNOSIS — R058 Other specified cough: Secondary | ICD-10-CM

## 2016-09-10 DIAGNOSIS — R05 Cough: Secondary | ICD-10-CM

## 2016-09-10 NOTE — Progress Notes (Signed)
Volcano-2: Phase 2- A double-blind, randomized, placebo controlled study of the efficacy and safety of three doses of orvepitant in subjects with chronic refractory cough.   This coordinator spoke with the PI, Dr. Simonne Maffucci on 03/Jun/18 to review this subjects possible participation in the above named study. Dr. Lake Bells gave approval for the subject to present for consenting on 04/Jun/18.  Refer to note documented on 22/May/18 by this coordinator regarding previous discussion with the subject regarding consent.   Clinical Research Coordinator / Research RN note : This visit for Subject Debra Cain with DOB: Feb 14, 1971 on 09/10/2016 for the above protocol and is for purpose of consenting . The consent for this encounter is under Protocol Version 5.0 and is currently IRB approved. Subject confirmed that there was no change in contact information (e.g. address, telephone, email). Subject thanked for participation in research and contribution to science.  Subject is a WOCBP and is currently using condoms as only form of birth control. Subject stated understanding that if she decided to consent to participate in the study another form of birth control would need to be added. Subject signed consent form on 04/Jun/18 at 14:35.  After consent given, subject stated she will begin an oral contraceptive regimen today, 04/Jun/2018. Subject aware to continue this regimen through 90 days after the last dose of IP.   Dr. Brand Males also met with the subject and subjects mother, [name] and reviewed the consent process and study requirements. This research coordinator has verified that the investigator is uptodate with his training.  No study procedures were completed prior to subject providing consent. Refer to the subjects paper source binder for further documentation of the informed consent process.   Signed by  [place] Bing, CMA, [place] Coordinator  [facility]   [place], [place] 2:50 PM 09/10/2016

## 2016-09-11 NOTE — Progress Notes (Signed)
Left detailed msg with results

## 2016-09-24 ENCOUNTER — Ambulatory Visit: Payer: 59 | Admitting: Pulmonary Disease

## 2016-09-24 ENCOUNTER — Ambulatory Visit (INDEPENDENT_AMBULATORY_CARE_PROVIDER_SITE_OTHER): Payer: 59 | Admitting: Adult Health

## 2016-09-24 ENCOUNTER — Encounter: Payer: Self-pay | Admitting: Adult Health

## 2016-09-24 DIAGNOSIS — R05 Cough: Secondary | ICD-10-CM | POA: Diagnosis not present

## 2016-09-24 DIAGNOSIS — R053 Chronic cough: Secondary | ICD-10-CM

## 2016-09-24 DIAGNOSIS — Z006 Encounter for examination for normal comparison and control in clinical research program: Secondary | ICD-10-CM | POA: Insufficient documentation

## 2016-09-24 NOTE — Progress Notes (Signed)
Title: A Double- Blind, Randomized, Placebo Controlled Study of the Efficacy and Safety of Three Doses of Orvepitant in Subjects With Chronic Refractory Cough Sponsor: [employer]; Protocol Number: VOLCANO-2;  NCT Trial #: [id number]; Phase of Development: Phase 2  Synopsis: Randomized subjects 1:1:1:1 entering a three week screening period and randomized at baseline/day 1 and followed throughout a 12 week double blind dosing. Doses of orvepitant (10mg/day, 20mg/day and 30mg/day) or a placebo will be administered and evaluated in the reduction of awake objective cough frequency  Key Inclusion Criteria:  Awake cough frequency of greater than/equal to 10 coughs/hour Female/Female subjects greater than/equal to 18 yrs. Diagnosis of CRC/unexplained cough for at least 1 year Non pregnant/lactating females Two forms of birth control- for female and female subjects  Key Exclusion Criteria: Current smokers or ex smokers within 6 months Recent respiratory tract infection Malignancy in past 5 years except if in remission or skin cancers unless approved by sponsor Treatment with Angiotensin Converting Enzyme inhibitors within 3 months of screening History of drug/alcohol abuse within 12 months of screening Hx of cystic fibrosis,idiopathic pulmonary fibrosis, moderate-severe asthma, bronchiectasis, COPD, CHF Significant or unstable psychiatric condition Renal transplant, current dialysis, or Hx of renal tubular acidosis -No GFR/Cr cut off noted Uncontrolled hypertension at screening Significant neurological disorder- **prior PMH or increased risk of seizures (not febrile), or recent (within 6 months) of head trauma or loss of consciousness or concussion Recent myocardial infarction (within 1 year of screening) drug use taken solely for treatment of cough prohibited from at least 1 month prior to screening) Concomitant respiratory medication is allowed, but subjects must be stable on medication  and take it for the duration of the study IMPORTANT MEDICATIONS PATIENT CAN NOT BE ON: grapefruit juice, St. John's Wort, digoxin, diltiazem, fluconazole, verapamil, ciprofloaxcin, amiodarone, carvedilol, azithromycin ---> All from Screening until 1 week after LAST dose  Key End Points: Change from baseline to week 12 in awake objective cough frequency measured with an automated cough monitor (Vitalojak) using 10mg, 20mg, 30mg orvepitant versus placebo  Key features of Volcano 2: Orvepitant is a centrally active, highly potent and selective NK(neurokinin)-1 receptor antagonist, recognized of its potential therapy for refractory cough Orvepitant was originally investigated for Major Depressive Disorder and PTSD Half Life of 7-15 hrs after single dose and increases to 34 hours after repeated dosing  Safety of Volcano 2 based on IB Version 6.0 and Date 09 Mar 2018__: Orvepitant has been administered in phase 2 studies to 557 patients to date- generally safe and well tolerated  A top dose of 30mg has been chosen based on efficacy/safety in [place]cano 1 Monitored with assessments via cough monitor, lab work (no fasting needed), EKG, cough questionnaires, spirometry, physical exams, vital signs and adverse event reporting Fatigue, Dizziness, dry mouth, nausea and palpitations were most common AEs reported As of 15 Jun 2016 one death has occured- not considered related to study drug (pg110 in IB) ?  Study [id number]: Adverse Events Reported of Subjects in Any Treatment Group (Preferred Terms) Table 5.4-4,5 in IB   Placebo Orevipitant 10mg        30mg          60mg  > 5% side effect    Headache 9% 0%            9%             9%  Diarrhea 5%  0%            6%               6%  Somnolence 3%  0%            9%             9%  Nausea 8%  0%             5%            5%  Dry Mouth 8% 0%             7%             7%  Fatigue 2% 0%             4%            6%  Palpitations 1%  0%             3%             1%      Rare but important: Convulsions    Seen in (3) subjects taking 60mg /day- dose has been eliminated       09/24/2016 Screening Research Visit  Pt is a never smoker with a chronic cough for >10 plus years.  She has seen multiple providers for treatment without improvement in cough .  She is interested in our Chronic cough study - says she has a dry cough every day that does not respond to any treatment .   VOLCANO -2 Study   This is A Double- Blind, Randomized, Placebo Controlled Study of the Efficacy and Safety of Three Doses of Orvepitant in Subjects With Chronic Refractory Cough Sponsor: [place]; Protocol Number: VOLCANO-2;  NCT Trial #: [id number]; Phase of Development: Phase 2  Synopsis: Randomized subjects 1:1:1:1 entering a three week screening period and randomized at baseline/day 1 and followed throughout a 12 week double blind dosing. Doses of orvepitant (10mg /day, 20mg /day and 30mg /day) or a placebo will be administered and evaluated in the reduction of awake objective cough frequency  Study was reviewed with pt in detail , exclusion and inclusion criteria were reviewed in detail with pt and pt EMR records. She meets criteria .   Protocol exam was completed and was normal .    ROS :  See HPI   EXAM  GEN: A/Ox3; pleasant , NAD, well nourished   HEENT:  [place]/AT,  EACs-clear, TMs-wnl, NOSE-clear, THROAT-clear, no lesions, no postnasal drip or exudate noted.   NECK:  Supple w/ fair ROM; no JVD; normal carotid impulses w/o bruits; no thyromegaly or nodules palpated; no lymphadenopathy.  RESP  Clear  P & A; w/o, wheezes/ rales/ or rhonchi.no accessory muscle use, no dullness to percussion  CARD:  RRR, no m/r/g  , no peripheral edema, pulses intact, no cyanosis or clubbing.  GI:   Soft & nt; nml bowel sounds; no organomegaly or masses detected.  Musco: Warm bil, no deformities or joint swelling noted.   Neuro: alert, no focal deficits noted.     Skin: Warm, no lesions or rashes

## 2016-09-24 NOTE — Assessment & Plan Note (Signed)
Refractory cough despite extensive evaluation and treatment options  See research notes.

## 2016-09-24 NOTE — Assessment & Plan Note (Signed)
Screening visit completed , pt meets inclusion/exclusion criteria .  Research exam completed and normal .  Will cont through research screening protocol .

## 2016-09-24 NOTE — Patient Instructions (Signed)
Continue with research protocol.  

## 2016-09-24 NOTE — Progress Notes (Signed)
Title: A Double- Blind, Randomized, Placebo Controlled Study of the Efficacy and Safety of Three Doses of Orvepitant in Subjects With Chronic Refractory Cough Sponsor: [employer]; Protocol Number: VOLCANO-2;  NCT Trial #: [id number]; Phase of Development: Phase 2  Synopsis: Randomized subjects 1:1:1:1 entering a three week screening period and randomized at baseline/day 1 and followed throughout a 12 week double blind dosing. Doses of orvepitant (10mg/day, 20mg/day and 30mg/day) or a placebo will be administered and evaluated in the reduction of awake objective cough frequency  Key Inclusion Criteria:  Awake cough frequency of greater than/equal to 10 coughs/hour Female/Female subjects greater than/equal to 18 yrs. Diagnosis of CRC/unexplained cough for at least 1 year Non pregnant/lactating females Two forms of birth control- for female and female subjects  Key Exclusion Criteria: Current smokers or ex smokers within 6 months Recent respiratory tract infection Malignancy in past 5 years except if in remission or skin cancers unless approved by sponsor Treatment with Angiotensin Converting Enzyme inhibitors within 3 months of screening History of drug/alcohol abuse within 12 months of screening Hx of cystic fibrosis,idiopathic pulmonary fibrosis, moderate-severe asthma, bronchiectasis, COPD, CHF Significant or unstable psychiatric condition Renal transplant, current dialysis, or Hx of renal tubular acidosis -No GFR/Cr cut off noted Uncontrolled hypertension at screening Significant neurological disorder- **prior PMH or increased risk of seizures (not febrile), or recent (within 6 months) of head trauma or loss of consciousness or concussion Recent myocardial infarction (within 1 year of screening) drug use taken solely for treatment of cough prohibited from at least 1 month prior to screening) Concomitant respiratory medication is allowed, but subjects must be stable on medication  and take it for the duration of the study IMPORTANT MEDICATIONS PATIENT CAN NOT BE ON: grapefruit juice, St. John's Wort, digoxin, diltiazem, fluconazole, verapamil, ciprofloaxcin, amiodarone, carvedilol, azithromycin ---> All from Screening until 1 week after LAST dose  Key End Points: Change from baseline to week 12 in awake objective cough frequency measured with an automated cough monitor (Vitalojak) using 10mg, 20mg, 30mg orvepitant versus placebo  Key features of Volcano 2: Orvepitant is a centrally active, highly potent and selective NK(neurokinin)-1 receptor antagonist, recognized of its potential therapy for refractory cough Orvepitant was originally investigated for Major Depressive Disorder and PTSD Half Life of 7-15 hrs after single dose and increases to 34 hours after repeated dosing  Safety of Volcano 2 based on IB Version 6.0 and Date 09 Mar 2018__: Orvepitant has been administered in phase 2 studies to 557 patients to date- generally safe and well tolerated  A top dose of 30mg has been chosen based on efficacy/safety in [place] 1 Monitored with assessments via cough monitor, lab work (no fasting needed), EKG, cough questionnaires, spirometry, physical exams, vital signs and adverse event reporting Fatigue, Dizziness, dry mouth, nausea and palpitations were most common AEs reported As of 15 Jun 2016 one death has occured- not considered related to study drug (pg110 in IB) ?  Study [id number]: Adverse Events Reported of Subjects in Any Treatment Group (Preferred Terms) Table 5.4-4,5 in IB   Placebo Orevipitant 10mg        30mg          60mg  > 5% side effect    Headache 9% 0%            9%             9%  Diarrhea 5%  0%            6%               6%  Somnolence 3%  0%            9%             9%  Nausea 8%  0%             5%            5%  Dry Mouth 8% 0%             7%             7%  Fatigue 2% 0%             4%            6%  Palpitations 1%  0%             3%             1%      Rare but important: Convulsions    Seen in (3) subjects taking 60mg /day- dose has been eliminated     Clinical Research officer, political party / Research RN note : This visit for Subject Debra Cain with DOB: Sep 12, 1970 on 09/24/2016 for the above protocol is Visit/Encounter # Screening  and is for purpose of research . The consent for this encounter is under Protocol Version 5.0 and is currently IRB approved. Subject expressed continued interest and consent in continuing as a study subject. Subject confirmed that there was no change in contact information (e.g. address, telephone, email). Subject thanked for participation in research and contribution to science.   In this visit 09/24/2016 the subject will be evaluated by investigator named Rexene Edison, NP . This research coordinator has verified that the investigator is uptodate with his/her training logs.  Subject consented on 04/Jun/18 and began oral contraception that evening. Subject states understanding of the study birth control requirements. See progress note from 04/Jun/18 for further documentation.   The use of concomitant medications was reviewed with the subject according to the protocol recommendations. Subject stated understanding of the restrictions.   All study procedures completed per the above listed protocol including but not limited to spirometry, EKG and lab assessments. The subject was also fitted with a cough monitor per protocol. Instructions given to the subject on monitor use. Subject will return on 19/Jun/18 at 16:00 to return monitor. Subject stated understanding.   Refer to the subjects paper source binder for further documentation.   Signed by Berlin Bing, CMA,  [place] Coordinator [facility]  [place], [place] 2:26 PM 09/24/2016

## 2016-09-25 NOTE — Progress Notes (Signed)
Clinical Research Coordinator / Research RN note : This visit for Subject Debra Cain with DOB: 1971-01-29 on 09/25/2016 is Cough Monitor Return post Screening Visit  and is for purpose of research . Subject expressed continued interest and consent in continuing as a study subject. Cough monitor removed from the subject successfully.   Dr. Lake Bells met briefly with the subject and discussed that the study is not a therapeutic trial and that the subject may get placebo. Subject stated understanding. Dr. Lake Bells also discussed the importance of the patient contacting us at any time if she has questions or concerns. Subject again stated understanding.    Signed by,  Aquilla Bing, [place], [place] Coordinator [facility]  [place], [place] 6:50 PM 09/25/2016

## 2016-10-01 NOTE — Progress Notes (Signed)
Reviewed, agree 

## 2016-10-15 ENCOUNTER — Ambulatory Visit (INDEPENDENT_AMBULATORY_CARE_PROVIDER_SITE_OTHER): Payer: 59 | Admitting: Internal Medicine

## 2016-10-15 ENCOUNTER — Encounter: Payer: Self-pay | Admitting: Internal Medicine

## 2016-10-15 VITALS — BP 120/70 | HR 66 | Temp 97.9°F | Resp 12 | Wt 126.8 lb

## 2016-10-15 DIAGNOSIS — R05 Cough: Secondary | ICD-10-CM

## 2016-10-15 DIAGNOSIS — Z006 Encounter for examination for normal comparison and control in clinical research program: Secondary | ICD-10-CM

## 2016-10-15 DIAGNOSIS — R053 Chronic cough: Secondary | ICD-10-CM

## 2016-10-15 NOTE — Progress Notes (Signed)
Reviewed and discussed with Sub-I Dr. Chase Caller.  Agree with randomization.

## 2016-10-15 NOTE — Progress Notes (Signed)
Title: A Double- Blind, Randomized, Placebo Controlled Study of the Efficacy and Safety of Three Doses of Orvepitant in Subjects With Chronic Refractory Cough. Sponsor: [facility]; Protocol Number: VOLCANO-2; .NCT Trial #: [id number]; Phase of Development: Phase 2  Synopsis: Randomized subjects 1:1:1:1 entering a three week screening period and randomized at baseline/day 1 and followed throughout a 12 week double blind dosing. Doses of orvepitant (10mg /day, 20mg /day and 30mg /day) or a placebo will be administered and evaluated in the reduction of awake objective cough frequency  Key Inclusion Criteria:  Awake cough frequency of greater than/equal to 10 coughs/hour Female/Female subjects greater than/equal to 18 yrs. Diagnosis of CRC/unexplained cough for at least 1 year Non pregnant/lactating females Two forms of birth control- for female and female subjects  Key Exclusion Criteria: Current smokers or ex smokers within 6 months Recent respiratory tract infection Malignancy in past 5 years except if in remission or skin cancers unless approved by sponsor Treatment with Angiotensin Converting Enzyme inhibitors within 3 months of screening History of drug/alcohol abuse within 12 months of screening Hx of cystic fibrosis,idiopathic pulmonary fibrosis, moderate-severe asthma, bronchiectasis, COPD, CHF Significant or unstable psychiatric condition Renal transplant, current dialysis, or Hx of renal tubular acidosis -No GFR/Cr cut off noted Uncontrolled hypertension at screening Significant neurological disorder- **prior PMH or increased risk of seizures (not febrile), or recent (within 6 months) of head trauma or loss of consciousness or concussion Recent myocardial infarction (within 1 year of screening) drug use taken solely for treatment of cough prohibited from at least 1 month prior to screening) Concomitant respiratory medication is allowed, but subjects must be stable on medication  and take it for the duration of the study IMPORTANT MEDICATIONS PATIENT CAN NOT BE ON: grapefruit juice, St. John's Wort, digoxin, diltiazem, fluconazole, verapamil, ciprofloaxcin, amiodarone, carvedilol, azithromycin ---> All from Screening until 1 week after LAST dose  Key End Points: Change from baseline to week 12 in awake objective cough frequency measured with an automated cough monitor (Vitalojak) using 10mg , 20mg , 30mg  orvepitant versus placebo  Key features of Volcano 2: Orvepitant is a centrally active, highly potent and selective NK(neurokinin)-1 receptor antagonist, recognized of its potential therapy for refractory cough. Half Life of 7-15 hrs after single dose and increases to 34 hours after repeated dosing  Safety of Volcano 2 based on IB Version 6.0 and Date 2016-07-10: Orvepitant has been administered in phase 2 studies to 557 patients to date- generally safe and well tolerated  A top dose of 30mg  has been chosen based on efficacy/safety in [place] [facility] Monitored with assessments via cough monitor, lab work (no fasting needed), EKG, cough questionnaires, spirometry, physical exams, vital signs and adverse event reporting Fatigue, Dizziness, dry mouth, nausea and palpitations were most common AEs reported As of July 11, 2022 07-31-2016 one death has occured- not considered related to study drug (pg110 in IB) ?  Study [id number]: Adverse Events Reported of Subjects in Any Treatment Group (Preferred Terms) Table 5.4-4,5 in IB   Placebo Orevipitant 10mg         30mg           60mg   > 5% side effect    Headache 9% 0%            9%             9%  Diarrhea 5%  0%            6%  6%  Somnolence 3%  0%            9%             9%  Nausea 8%  0%             5%            5%  Dry Mouth 8% 0%             7%             7%  Fatigue 2% 0%             4%            6%  Palpitations 1%  0%             3%            1%      Rare but important: Convulsions    Seen in (3) subjects taking  60mg /day- dose has been eliminated        ...................................................   SUBJECTIVE   S: Randomization visit for above.  I am seeing as Sub-I. A-priori visit was d/w Dr Christiana Fuchs Dr Lake Bells. Chronic refractory cough. She is ready for randomization. Went over I/E criteria all over again. She meets criteria - EKG normal. She expressed continued interest in research participation  O: exam in paper source with vitals  A    ICD-10-CM   1. Research subject Z00.6 EKG 12-Lead  2. Chronic cough R05 EKG 12-Lead    P Randomization form signed Proceed to Randomization   Dr. Brand Males, M.D., [facility].C.P Pulmonary and Critical Care Medicine Staff Physician [facility] Pager: [phone], If no answer or between  15:00h - 7:00h: call [phone]  10/15/2016 12:04 PM

## 2016-10-15 NOTE — Progress Notes (Signed)
Reviewed, agree with randomization

## 2016-10-15 NOTE — Patient Instructions (Signed)
ICD-10-CM   1. Research subject Z00.6 EKG 12-Lead  2. Chronic cough R05 EKG 12-Lead   Proceed to randomization

## 2016-10-17 NOTE — Progress Notes (Signed)
Title: A Double- Blind, Randomized, Placebo Controlled Study of the Efficacy and Safety of Three Doses of Orvepitant in Subjects With Chronic Refractory Cough Sponsor: [employer]; Protocol Number: VOLCANO-2;  NCT Trial #: [id number]; Phase of Development: Phase 2  Synopsis: Randomized subjects 1:1:1:1 entering a three week screening period and randomized at baseline/day 1 and followed throughout a 12 week double blind dosing. Doses of orvepitant (10mg/day, 20mg/day and 30mg/day) or a placebo will be administered and evaluated in the reduction of awake objective cough frequency  Key Inclusion Criteria:  Awake cough frequency of greater than/equal to 10 coughs/hour Female/Female subjects greater than/equal to 18 yrs. Diagnosis of CRC/unexplained cough for at least 1 year Non pregnant/lactating females Two forms of birth control- for female and female subjects  Key Exclusion Criteria: Current smokers or ex smokers within 6 months Recent respiratory tract infection Malignancy in past 5 years except if in remission or skin cancers unless approved by sponsor Treatment with Angiotensin Converting Enzyme inhibitors within 3 months of screening History of drug/alcohol abuse within 12 months of screening Hx of cystic fibrosis,idiopathic pulmonary fibrosis, moderate-severe asthma, bronchiectasis, COPD, CHF Significant or unstable psychiatric condition Renal transplant, current dialysis, or Hx of renal tubular acidosis -No GFR/Cr cut off noted Uncontrolled hypertension at screening Significant neurological disorder- **prior PMH or increased risk of seizures (not febrile), or recent (within 6 months) of head trauma or loss of consciousness or concussion Recent myocardial infarction (within 1 year of screening) drug use taken solely for treatment of cough prohibited from at least 1 month prior to screening) Concomitant respiratory medication is allowed, but subjects must be stable on medication  and take it for the duration of the study IMPORTANT MEDICATIONS PATIENT CAN NOT BE ON: grapefruit juice, St. John's Wort, digoxin, diltiazem, fluconazole, verapamil, ciprofloaxcin, amiodarone, carvedilol, azithromycin ---> All from Screening until 1 week after LAST dose  Key End Points: Change from baseline to week 12 in awake objective cough frequency measured with an automated cough monitor (Vitalojak) using 10mg, 20mg, 30mg orvepitant versus placebo  Key features of Volcano 2: Orvepitant is a centrally active, highly potent and selective NK(neurokinin)-1 receptor antagonist, recognized of its potential therapy for refractory cough Orvepitant was originally investigated for Major Depressive Disorder and PTSD Half Life of 7-15 hrs after single dose and increases to 34 hours after repeated dosing  Safety of Volcano 2 based on IB Version 6.0 and Date 09 Mar 2018__: Orvepitant has been administered in phase 2 studies to 557 patients to date- generally safe and well tolerated  A top dose of 30mg has been chosen based on efficacy/safety in Volcano 1 Monitored with assessments via cough monitor, lab work (no fasting needed), EKG, cough questionnaires, spirometry, physical exams, vital signs and adverse event reporting Fatigue, Dizziness, dry mouth, nausea and palpitations were most common AEs reported As of 15 Jun 2016 one death has occured- not considered related to study drug (pg110 in IB) ?  Study [id number]: Adverse Events Reported of Subjects in Any Treatment Group (Preferred Terms) Table 5.4-4,5 in IB   Placebo Orevipitant 10mg        30mg          60mg  > 5% side effect    Headache 9% 0%            9%             9%  Diarrhea 5%  0%            6%               6%  Somnolence 3%  0%            9%             9%  Nausea 8%  0%             5%            5%  Dry Mouth 8% 0%             7%             7%  Fatigue 2% 0%             4%            6%  Palpitations 1%  0%             3%             1%      Rare but important: Convulsions    Seen in (3) subjects taking 15m/day- dose has been eliminated    Clinical RResearch officer, political party/ Research RN note : This visit for Subject Debra ROLLINSwith DOB: [birth year]on 10/15/2016 for the above protocol is Visit/Encounter # Baseline Day 1 Randomization  and is for purpose of research . The consent for this encounter is under Protocol Version 5.0 and is currently IRB approved.Subject expressed continued interest and consent in continuing as a study subject. Subject confirmed that there was no change in contact information (e.g. address, telephone, email). Subject thanked for participation in research and contribution to science.   In this visit 10/15/2016 the subject will be evaluated by investigator named Dr. RChase Caller . This research coordinator has verified that the investigator is uptodate with his/her training logs   Because the PI is NOT available due to schedule issues, the sub-I reported and CRC has confirmed that the PI has discussed the visit a-priori with the sub-investigator  The CRC has confirmed that the note will be routed to PI by sub-I if possible.   Inclusion/Exclusion Criteria was reviewed and confirmed that subject met all criteria to be randomized into this study. Investigator verified and signed I/E criteria. Subject denies any new complaints at this time. Conmed and medical history reviewed, no changes since Screening visit. Subject questionnaires completed per protocol and filed in subject paper source binder. After all required procedures were completed including physical exam, subject was determined to be eligible to randomize. Subject was assigned IP and first dose was administered in the clinic at 12:15. Subject was observed for 30 minutes post dose by this coordinator and LThedore Mins RN. Investigator was also immediately available for post IP administration and was informed of the subjects status at time of departure.  At 12:45 subject denied any complaints, stated "felt normal" post dose, so subject provided reminders for next visit and allowed to leave the site.   Please refer to the subjects paper source binder for further documentation as needed.    Signed by  JRandolph Bing CMA, CArcolaCoordinator  [facility]  [place]:34 AM 10/17/2016

## 2016-10-19 NOTE — Progress Notes (Signed)
PI Oversight note: reviewed, agree with above

## 2016-10-22 DIAGNOSIS — H16223 Keratoconjunctivitis sicca, not specified as Sjogren's, bilateral: Secondary | ICD-10-CM | POA: Diagnosis not present

## 2016-10-22 DIAGNOSIS — M3501 Sicca syndrome with keratoconjunctivitis: Secondary | ICD-10-CM | POA: Diagnosis not present

## 2016-10-22 DIAGNOSIS — H01009 Unspecified blepharitis unspecified eye, unspecified eyelid: Secondary | ICD-10-CM | POA: Diagnosis not present

## 2016-10-24 DIAGNOSIS — M2500 Hemarthrosis, unspecified joint: Secondary | ICD-10-CM | POA: Diagnosis not present

## 2016-10-29 DIAGNOSIS — R05 Cough: Secondary | ICD-10-CM

## 2016-10-29 DIAGNOSIS — R053 Chronic cough: Secondary | ICD-10-CM

## 2016-10-29 DIAGNOSIS — Z006 Encounter for examination for normal comparison and control in clinical research program: Secondary | ICD-10-CM

## 2016-10-29 NOTE — Progress Notes (Signed)
Title: A Double- Blind, Randomized, Placebo Controlled Study of the Efficacy and Safety of Three Doses of Orvepitant in Subjects With Chronic Refractory Cough Sponsor: [employer]; Protocol Number: VOLCANO-2;  NCT Trial #: [id number]; Phase of Development: Phase 2  Synopsis: Randomized subjects 1:1:1:1 entering a three week screening period and randomized at baseline/day 1 and followed throughout a 12 week double blind dosing. Doses of orvepitant (10mg/day, 20mg/day and 30mg/day) or a placebo will be administered and evaluated in the reduction of awake objective cough frequency  Key Inclusion Criteria:  Awake cough frequency of greater than/equal to 10 coughs/hour Female/Female subjects greater than/equal to 18 yrs. Diagnosis of CRC/unexplained cough for at least 1 year Non pregnant/lactating females Two forms of birth control- for female and female subjects  Key Exclusion Criteria: Current smokers or ex smokers within 6 months Recent respiratory tract infection Malignancy in past 5 years except if in remission or skin cancers unless approved by sponsor Treatment with Angiotensin Converting Enzyme inhibitors within 3 months of screening History of drug/alcohol abuse within 12 months of screening Hx of cystic fibrosis,idiopathic pulmonary fibrosis, moderate-severe asthma, bronchiectasis, COPD, CHF Significant or unstable psychiatric condition Renal transplant, current dialysis, or Hx of renal tubular acidosis -No GFR/Cr cut off noted Uncontrolled hypertension at screening Significant neurological disorder- **prior PMH or increased risk of seizures (not febrile), or recent (within 6 months) of head trauma or loss of consciousness or concussion Recent myocardial infarction (within 1 year of screening) drug use taken solely for treatment of cough prohibited from at least 1 month prior to screening) Concomitant respiratory medication is allowed, but subjects must be stable on medication  and take it for the duration of the study IMPORTANT MEDICATIONS PATIENT CAN NOT BE ON: grapefruit juice, St. John's Wort, digoxin, diltiazem, fluconazole, verapamil, ciprofloaxcin, amiodarone, carvedilol, azithromycin ---> All from Screening until 1 week after LAST dose  Key End Points: Change from baseline to week 12 in awake objective cough frequency measured with an automated cough monitor (Vitalojak) using 10mg, 20mg, 30mg orvepitant versus placebo  Key features of Volcano 2: Orvepitant is a centrally active, highly potent and selective NK(neurokinin)-1 receptor antagonist, recognized of its potential therapy for refractory cough Orvepitant was originally investigated for Major Depressive Disorder and PTSD Half Life of 7-15 hrs after single dose and increases to 34 hours after repeated dosing  Safety of Volcano 2 based on IB Version 6.0 and Date 09 Mar 2018__: Orvepitant has been administered in phase 2 studies to 557 patients to date- generally safe and well tolerated  A top dose of 30mg has been chosen based on efficacy/safety in Volcano 1 Monitored with assessments via cough monitor, lab work (no fasting needed), EKG, cough questionnaires, spirometry, physical exams, vital signs and adverse event reporting Fatigue, Dizziness, dry mouth, nausea and palpitations were most common AEs reported As of 15 Jun 2016 one death has occured- not considered related to study drug (pg110 in IB) ?  Study [id number]: Adverse Events Reported of Subjects in Any Treatment Group (Preferred Terms) Table 5.4-4,5 in IB   Placebo Orevipitant 10mg        30mg          60mg  > 5% side effect    Headache 9% 0%            9%             9%  Diarrhea 5%  0%            6%               6%  Somnolence 3%  0%            9%             9%  Nausea 8%  0%             5%            5%  Dry Mouth 8% 0%             7%             7%  Fatigue 2% 0%             4%            6%  Palpitations 1%  0%             3%             1%      Rare but important: Convulsions    Seen in (3) subjects taking 60mg /day- dose has been eliminated     Clinical Research officer, political party / Research RN note : This visit for Subject Debra Cain with DOB: [birth year] on 10/29/2016 for the above protocol is Visit/Encounter # Week 2  and is for purpose of research . The consent for this encounter is under Protocol Version 5.0 and is currently IRB approved. Subject expressed continued interest and consent in continuing as a study subject. Subject confirmed that there was no change in contact information (e.g. address, telephone, email). Subject thanked for participation in research and contribution to science.   Procedures completed per the above stated protocol. Subject denies any new complaints or worsening of any existing conditions. Cough monitor attached per protocol. Subject will return on October 30, 2016 at 11:00 am to return monitor. Refer to subjects paper source binder for further visit documentation.    Signed by  McCracken Bing, [place], [place] 1:57 PM 10/29/2016

## 2016-11-09 ENCOUNTER — Encounter: Payer: Self-pay | Admitting: Nurse Practitioner

## 2016-11-09 ENCOUNTER — Ambulatory Visit (INDEPENDENT_AMBULATORY_CARE_PROVIDER_SITE_OTHER): Payer: 59 | Admitting: Nurse Practitioner

## 2016-11-09 ENCOUNTER — Other Ambulatory Visit: Payer: 59

## 2016-11-09 VITALS — BP 110/68 | HR 80 | Temp 97.9°F | Ht 65.0 in | Wt 129.0 lb

## 2016-11-09 DIAGNOSIS — M791 Myalgia, unspecified site: Secondary | ICD-10-CM

## 2016-11-09 DIAGNOSIS — M2560 Stiffness of unspecified joint, not elsewhere classified: Secondary | ICD-10-CM | POA: Diagnosis not present

## 2016-11-09 DIAGNOSIS — R768 Other specified abnormal immunological findings in serum: Secondary | ICD-10-CM

## 2016-11-09 NOTE — Patient Instructions (Signed)
Go to basement for blood draw 

## 2016-11-09 NOTE — Progress Notes (Signed)
Subjective:  Patient ID: Debra Cain, female    DOB: 11/14/1970  Age: 46 y.o. MRN: [id number]  CC: Follow-up (Coughing for along time---follow up on blood work result--has copy--)   HPI Positive RA factor: Lab done by opthalmology due to dry eyes, chronic cough, chronic fatigue, multiple joint pain and stiffness, anorexia, and FH of RA(cousin with RA). She will like lab repeated and referral to rheumatology.  Outpatient Medications Prior to Visit  Medication Sig Dispense Refill  . norethindrone-ethinyl estradiol (JUNEL FE,GILDESS FE,LOESTRIN FE) 1-20 MG-MCG tablet Take 1 tablet by mouth daily.    . sertraline (ZOLOFT) 100 MG tablet Take 1 tablet (100 mg total) by mouth daily. 90 tablet 3   No facility-administered medications prior to visit.     ROS See HPI  Objective:  BP 110/68   Pulse 80   Temp 97.9 F (36.6 C)   Ht 5\' 5"  (1.651 m)   Wt 129 lb (58.5 kg)   SpO2 100%   BMI 21.47 kg/m   BP Readings from Last 3 Encounters:  11/09/16 110/68  10/15/16 120/70  09/24/16 112/68    Wt Readings from Last 3 Encounters:  11/09/16 129 lb (58.5 kg)  10/15/16 126 lb 12.8 oz (57.5 kg)  09/24/16 127 lb (57.6 kg)    Physical Exam  Constitutional: She is oriented to person, place, and time.  Cardiovascular: Normal rate.   Pulmonary/Chest: Effort normal.  Musculoskeletal: Normal range of motion. She exhibits no deformity.  Neurological: She is alert and oriented to person, place, and time.  Skin: Skin is warm and dry. No rash noted. No erythema.  Vitals reviewed.   Lab Results  Component Value Date   WBC 6.9 06/17/2014   HGB 14.7 06/17/2014   HCT 42.6 06/17/2014   PLT 369 06/17/2014   GLUCOSE 87 10/19/2013   CHOL 166 10/19/2013   TRIG 39.0 10/19/2013   HDL 47.90 10/19/2013   LDLCALC 110 (H) 10/19/2013   ALT 13 10/19/2013   AST 17 10/19/2013   NA 139 10/19/2013   K 4.1 10/19/2013   CL 105 10/19/2013   CREATININE 0.5 10/19/2013   BUN 11 10/19/2013   CO2 26  10/19/2013   TSH 1.69 10/19/2013    Dg Chest 2 View  Result Date: 09/10/2016 CLINICAL DATA:  Chronic cough, upper airway syndrome, cough variant asthma, nonsmoker. EXAM: CHEST  2 VIEW COMPARISON:  Chest x-ray of November 02, 2013 FINDINGS: The lungs are adequately inflated and clear. The heart and pulmonary vascularity are normal. The mediastinum is normal in width. There is no pleural effusion. The bony thorax exhibits no acute abnormality. There is calcification in the wall of the aortic arch. IMPRESSION: There is no pneumonia, CHF, nor other acute cardiopulmonary abnormality. Thoracic aortic atherosclerosis. Electronically Signed   By: David  Martinique M.D.   On: 09/10/2016 15:20    Assessment & Plan:   Bonnye was seen today for follow-up.  Diagnoses and all orders for this visit:  Rheumatoid factor positive -     Ambulatory referral to Rheumatology  Joint stiffness -     Rheumatoid Factor; Future -     Antinuclear Antib (ANA); Future  Myalgia -     Rheumatoid Factor; Future -     Antinuclear Antib (ANA); Future   I am having Ms. Nuzzo maintain her sertraline, norethindrone-ethinyl estradiol, and PLACEBO PO.  Meds ordered this encounter  Medications  . PLACEBO PO    Sig: Take by mouth. Using this for cough  study    Follow-up: No Follow-up on file.  Wilfred Lacy, NP

## 2016-11-12 ENCOUNTER — Other Ambulatory Visit: Payer: Self-pay

## 2016-11-12 LAB — ANA: Anti Nuclear Antibody(ANA): NEGATIVE

## 2016-11-12 LAB — RHEUMATOID FACTOR: RHEUMATOID FACTOR: 249 [IU]/mL — AB (ref <14)

## 2016-11-13 ENCOUNTER — Telehealth: Payer: Self-pay | Admitting: Nurse Practitioner

## 2016-11-13 ENCOUNTER — Emergency Department (HOSPITAL_COMMUNITY): Admission: EM | Admit: 2016-11-13 | Payer: 59
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
## 2016-11-13 NOTE — Telephone Encounter (Signed)
Patient requesting call back with lab results.

## 2016-11-13 NOTE — Telephone Encounter (Signed)
Pt is aware.  

## 2016-12-07 DIAGNOSIS — R05 Cough: Secondary | ICD-10-CM

## 2016-12-07 DIAGNOSIS — R053 Chronic cough: Secondary | ICD-10-CM

## 2016-12-07 DIAGNOSIS — Z006 Encounter for examination for normal comparison and control in clinical research program: Secondary | ICD-10-CM

## 2016-12-07 NOTE — Progress Notes (Unsigned)
Title: A Double- Blind, Randomized, Placebo Controlled Study of the Efficacy and Safety of Three Doses of Orvepitant in Subjects With Chronic Refractory Cough Sponsor: [place]; Protocol Number: VOLCANO-2;  NCT Trial #: [id number]; Phase of Development: Phase 2  Clinical Research Coordinator / Research RN note : This visit for Subject Debra Cain with DOB: [birth year] on 12/07/2016 for the above protocol is Visit/Encounter # [id number]  and is for purpose of Week 8 procedures. The consent for this encounter is under Protocol Version 5 and  is currently IRB approved. Subject expressed continued interest and consent in continuing as a study subject. Subject confirmed that there was  no change in contact information (e.g. address, telephone, email). Subject thanked for participation in research and contribution to science.   In this visit 12/07/2016 the subject will be not evaluated by an investigator (not required by protocol and subject has had no changes in health or medications).  Patient arrived to visit today and confirmed that she has not yet taken morning dose of medication.  See paper source for time of last dose.  Patient assigned new IP kit, appropriate kit verified by this RN and Thedore Mins, RN.  Patient reports no changes in her health or medications, current concomitant medication list is correct.  Procedures completed according to order of assessments, see paper source for documentation.  12 lead ECG was performed utilizing site equipment.  Visit conducted by this RN and Thedore Mins, RN.   Signed by  Doreatha Martin, RN, BSN, [place] Nurse II [facility] Office [phone] 10:43 AM 12/07/2016

## 2016-12-14 NOTE — Progress Notes (Signed)
Title: A Double- Blind, Randomized, Placebo Controlled Study of the Efficacy and Safety of Three Doses of Orvepitant in Subjects With Chronic Refractory Cough Sponsor: [employer]; Protocol Number: VOLCANO-2;  NCT Trial #: [id number]; Phase of Development: Phase 2  Synopsis: Randomized subjects 1:1:1:1 entering a three week screening period and randomized at baseline/day 1 and followed throughout a 12 week double blind dosing. Doses of orvepitant (10mg/day, 20mg/day and 30mg/day) or a placebo will be administered and evaluated in the reduction of awake objective cough frequency  Key Inclusion Criteria:  Awake cough frequency of greater than/equal to 10 coughs/hour Female/Female subjects greater than/equal to 18 yrs. Diagnosis of CRC/unexplained cough for at least 1 year Non pregnant/lactating females Two forms of birth control- for female and female subjects  Key Exclusion Criteria: Current smokers or ex smokers within 6 months Recent respiratory tract infection Malignancy in past 5 years except if in remission or skin cancers unless approved by sponsor Treatment with Angiotensin Converting Enzyme inhibitors within 3 months of screening History of drug/alcohol abuse within 12 months of screening Hx of cystic fibrosis,idiopathic pulmonary fibrosis, moderate-severe asthma, bronchiectasis, COPD, CHF Significant or unstable psychiatric condition Renal transplant, current dialysis, or Hx of renal tubular acidosis -No GFR/Cr cut off noted Uncontrolled hypertension at screening Significant neurological disorder- **prior PMH or increased risk of seizures (not febrile), or recent (within 6 months) of head trauma or loss of consciousness or concussion Recent myocardial infarction (within 1 year of screening) drug use taken solely for treatment of cough prohibited from at least 1 month prior to screening) Concomitant respiratory medication is allowed, but subjects must be stable on medication  and take it for the duration of the study IMPORTANT MEDICATIONS PATIENT CAN NOT BE ON: grapefruit juice, St. John's Wort, digoxin, diltiazem, fluconazole, verapamil, ciprofloaxcin, amiodarone, carvedilol, azithromycin ---> All from Screening until 1 week after LAST dose  Key End Points: Change from baseline to week 12 in awake objective cough frequency measured with an automated cough monitor (Vitalojak) using 10mg, 20mg, 30mg orvepitant versus placebo  Key features of Volcano 2: Orvepitant is a centrally active, highly potent and selective NK(neurokinin)-1 receptor antagonist, recognized of its potential therapy for refractory cough Orvepitant was originally investigated for Major Depressive Disorder and PTSD Half Life of 7-15 hrs after single dose and increases to 34 hours after repeated dosing  Safety of Volcano 2 based on IB Version 6.0 and Date 09 Mar 2018__: Orvepitant has been administered in phase 2 studies to 557 patients to date- generally safe and well tolerated  A top dose of 30mg has been chosen based on efficacy/safety in Volcano 1 Monitored with assessments via cough monitor, lab work (no fasting needed), EKG, cough questionnaires, spirometry, physical exams, vital signs and adverse event reporting Fatigue, Dizziness, dry mouth, nausea and palpitations were most common AEs reported As of 15 Jun 2016 one death has occured- not considered related to study drug (pg110 in IB) ?  Study [id number]: Adverse Events Reported of Subjects in Any Treatment Group (Preferred Terms) Table 5.4-4,5 in IB   Placebo Orevipitant 10mg        30mg          60mg  > 5% side effect    Headache 9% 0%            9%             9%  Diarrhea 5%  0%            6%               6%  Somnolence 3%  0%            9%             9%  Nausea 8%  0%             5%            5%  Dry Mouth 8% 0%             7%             7%  Fatigue 2% 0%             4%            6%  Palpitations 1%  0%             3%             1%      Rare but important: Convulsions    Seen in (3) subjects taking 60mg /day- dose has been eliminated       Clinical Research officer, political party / Research RN note : This visit for Subject Debra Cain with DOB: 1970/11/23 on 12/14/2016 for the above protocol for an unscheduled visit for the purpose of lab redraw. Hematology was labeled too old for analysis, Chemistry was able to be resulted, which showed a glucose level of 54. Per PI glucose and hematology needed to be repeated.Per PI this Glucose level is considered clinically significant. Subject presented today and stated/presented no significant symptoms and could not confirm if she was fasting at time of visit.  Dr. Lake Bells is aware and will review new labs once resulted.  Signed by  August Luz  Clinical Research Coordinator / Nurse [facility]  [place], [place] 4:28 PM 12/14/2016

## 2016-12-17 DIAGNOSIS — H16223 Keratoconjunctivitis sicca, not specified as Sjogren's, bilateral: Secondary | ICD-10-CM | POA: Diagnosis not present

## 2016-12-17 DIAGNOSIS — M3501 Sicca syndrome with keratoconjunctivitis: Secondary | ICD-10-CM | POA: Diagnosis not present

## 2016-12-17 DIAGNOSIS — H01009 Unspecified blepharitis unspecified eye, unspecified eyelid: Secondary | ICD-10-CM | POA: Diagnosis not present

## 2017-01-08 DIAGNOSIS — R05 Cough: Secondary | ICD-10-CM

## 2017-01-08 DIAGNOSIS — R053 Chronic cough: Secondary | ICD-10-CM

## 2017-01-08 DIAGNOSIS — Z006 Encounter for examination for normal comparison and control in clinical research program: Secondary | ICD-10-CM

## 2017-01-08 NOTE — Progress Notes (Signed)
Title: A Double- Blind, Randomized, Placebo Controlled Study of the Efficacy and Safety of Three Doses of Orvepitant in Subjects With Chronic Refractory Cough Sponsor: [employer]; Protocol Number: VOLCANO-2;  NCT Trial #: [id number]; Phase of Development: Phase 2  Synopsis: Randomized subjects 1:1:1:1 entering a three week screening period and randomized at baseline/day 1 and followed throughout a 12 week double blind dosing. Doses of orvepitant (10mg/day, 20mg/day and 30mg/day) or a placebo will be administered and evaluated in the reduction of awake objective cough frequency  Key Inclusion Criteria:  Awake cough frequency of greater than/equal to 10 coughs/hour Female/Female subjects greater than/equal to 18 yrs. Diagnosis of CRC/unexplained cough for at least 1 year Non pregnant/lactating females Two forms of birth control- for female and female subjects  Key Exclusion Criteria: Current smokers or ex smokers within 6 months Recent respiratory tract infection Malignancy in past 5 years except if in remission or skin cancers unless approved by sponsor Treatment with Angiotensin Converting Enzyme inhibitors within 3 months of screening History of drug/alcohol abuse within 12 months of screening Hx of cystic fibrosis,idiopathic pulmonary fibrosis, moderate-severe asthma, bronchiectasis, COPD, CHF Significant or unstable psychiatric condition Renal transplant, current dialysis, or Hx of renal tubular acidosis -No GFR/Cr cut off noted Uncontrolled hypertension at screening Significant neurological disorder- **prior PMH or increased risk of seizures (not febrile), or recent (within 6 months) of head trauma or loss of consciousness or concussion Recent myocardial infarction (within 1 year of screening) drug use taken solely for treatment of cough prohibited from at least 1 month prior to screening) Concomitant respiratory medication is allowed, but subjects must be stable on medication  and take it for the duration of the study IMPORTANT MEDICATIONS PATIENT CAN NOT BE ON: grapefruit juice, St. John's Wort, digoxin, diltiazem, fluconazole, verapamil, ciprofloaxcin, amiodarone, carvedilol, azithromycin ---> All from Screening until 1 week after LAST dose  Key End Points: Change from baseline to week 12 in awake objective cough frequency measured with an automated cough monitor (Vitalojak) using 10mg, 20mg, 30mg orvepitant versus placebo  Key features of Volcano 2: Orvepitant is a centrally active, highly potent and selective NK(neurokinin)-1 receptor antagonist, recognized of its potential therapy for refractory cough Orvepitant was originally investigated for Major Depressive Disorder and PTSD Half Life of 7-15 hrs after single dose and increases to 34 hours after repeated dosing  Safety of Volcano 2 based on IB Version 6.0 and Date 09 Mar 2018__: Orvepitant has been administered in phase 2 studies to 557 patients to date- generally safe and well tolerated  A top dose of 30mg has been chosen based on efficacy/safety in Volcano 1 Monitored with assessments via cough monitor, lab work (no fasting needed), EKG, cough questionnaires, spirometry, physical exams, vital signs and adverse event reporting Fatigue, Dizziness, dry mouth, nausea and palpitations were most common AEs reported As of 15 Jun 2016 one death has occured- not considered related to study drug (pg110 in IB) ?  Study [id number]: Adverse Events Reported of Subjects in Any Treatment Group (Preferred Terms) Table 5.4-4,5 in IB   Placebo Orevipitant 10mg        30mg          60mg  > 5% side effect    Headache 9% 0%            9%             9%  Diarrhea 5%  0%            6%               6%  Somnolence 3%  0%            9%             9%  Nausea 8%  0%             5%            5%  Dry Mouth 8% 0%             7%             7%  Fatigue 2% 0%             4%            6%  Palpitations 1%  0%             3%             1%      Rare but important: Convulsions    Seen in (3) subjects taking 60mg /day- dose has been eliminated    Clinical Research officer, political party / Research RN note : This visit for Subject Debra Cain with DOB: 1971-02-21 on 01/08/2017 for the above protocol is Visit/Encounter # Week 12  and is for purpose of research . The consent for this encounter is under Protocol Version 5.0  And is currently IRB approved.Subject expressed continued interest and consent in continuing as a study subject. Subject confirmed that there was no change in contact information (e.g. address, telephone, email). Subject thanked for participation in research and contribution to science.   All study procedures were completed per the above mentioned protocol. EKG performed and reviewed by PI Dr. Lake Bells.  Cough monitor placed on subject and instructions provided. Subject stated understanding and will return the cough monitor on January 09, 2017. Refer to the subjects paper source binder for further documentation. Thedore Mins, Research Nurse also participated in this visit.     Signed by [place] Bing, [facility]  Certified [place] Coordinator [place], [place] 2:20 PM 01/08/2017                              .

## 2017-01-14 DIAGNOSIS — Z01419 Encounter for gynecological examination (general) (routine) without abnormal findings: Secondary | ICD-10-CM | POA: Diagnosis not present

## 2017-01-14 DIAGNOSIS — Z6821 Body mass index (BMI) 21.0-21.9, adult: Secondary | ICD-10-CM | POA: Diagnosis not present

## 2017-01-17 DIAGNOSIS — R5383 Other fatigue: Secondary | ICD-10-CM | POA: Diagnosis not present

## 2017-01-17 DIAGNOSIS — M3500 Sicca syndrome, unspecified: Secondary | ICD-10-CM | POA: Diagnosis not present

## 2017-01-17 DIAGNOSIS — R682 Dry mouth, unspecified: Secondary | ICD-10-CM | POA: Diagnosis not present

## 2017-01-17 DIAGNOSIS — R05 Cough: Secondary | ICD-10-CM | POA: Diagnosis not present

## 2017-01-21 ENCOUNTER — Encounter: Payer: Self-pay | Admitting: Internal Medicine

## 2017-01-21 ENCOUNTER — Ambulatory Visit (INDEPENDENT_AMBULATORY_CARE_PROVIDER_SITE_OTHER): Payer: 59 | Admitting: Internal Medicine

## 2017-01-21 VITALS — BP 112/72 | HR 66 | Temp 97.8°F | Resp 14 | Wt 126.0 lb

## 2017-01-21 DIAGNOSIS — R05 Cough: Secondary | ICD-10-CM

## 2017-01-21 DIAGNOSIS — R053 Chronic cough: Secondary | ICD-10-CM

## 2017-01-21 DIAGNOSIS — Z006 Encounter for examination for normal comparison and control in clinical research program: Secondary | ICD-10-CM

## 2017-01-21 NOTE — Progress Notes (Signed)
Title: A Double- Blind, Randomized, Placebo Controlled Study of the Efficacy and Safety of Three Doses of Orvepitant in Subjects With Chronic Refractory Cough Sponsor: [employer]; Protocol Number: VOLCANO-2;  NCT Trial #: [id number]; Phase of Development: Phase 2  Synopsis: Randomized subjects 1:1:1:1 entering a three week screening period and randomized at baseline/day 1 and followed throughout a 12 week double blind dosing. Doses of orvepitant (10mg/day, 20mg/day and 30mg/day) or a placebo will be administered and evaluated in the reduction of awake objective cough frequency  Key Inclusion Criteria:  Awake cough frequency of greater than/equal to 10 coughs/hour Female/Female subjects greater than/equal to 18 yrs. Diagnosis of CRC/unexplained cough for at least 1 year Non pregnant/lactating females Two forms of birth control- for female and female subjects  Key Exclusion Criteria: Current smokers or ex smokers within 6 months Recent respiratory tract infection Malignancy in past 5 years except if in remission or skin cancers unless approved by sponsor Treatment with Angiotensin Converting Enzyme inhibitors within 3 months of screening History of drug/alcohol abuse within 12 months of screening Hx of cystic fibrosis,idiopathic pulmonary fibrosis, moderate-severe asthma, bronchiectasis, COPD, CHF Significant or unstable psychiatric condition Renal transplant, current dialysis, or Hx of renal tubular acidosis -No GFR/Cr cut off noted Uncontrolled hypertension at screening Significant neurological disorder- **prior PMH or increased risk of seizures (not febrile), or recent (within 6 months) of head trauma or loss of consciousness or concussion Recent myocardial infarction (within 1 year of screening) drug use taken solely for treatment of cough prohibited from at least 1 month prior to screening) Concomitant respiratory medication is allowed, but subjects must be stable on medication  and take it for the duration of the study IMPORTANT MEDICATIONS PATIENT CAN NOT BE ON: grapefruit juice, St. John's Wort, digoxin, diltiazem, fluconazole, verapamil, ciprofloaxcin, amiodarone, carvedilol, azithromycin ---> All from Screening until 1 week after LAST dose  Key End Points: Change from baseline to week 12 in awake objective cough frequency measured with an automated cough monitor (Vitalojak) using 10mg, 20mg, 30mg orvepitant versus placebo  Key features of Volcano 2: Orvepitant is a centrally active, highly potent and selective NK(neurokinin)-1 receptor antagonist, recognized of its potential therapy for refractory cough Orvepitant was originally investigated for Major Depressive Disorder and PTSD Half Life of 7-15 hrs after single dose and increases to 34 hours after repeated dosing  Safety of Volcano 2 based on IB Version 6.0 and Date 09 Mar 2018__: Orvepitant has been administered in phase 2 studies to 557 patients to date- generally safe and well tolerated  A top dose of 30mg has been chosen based on efficacy/safety in Volcano 1 Monitored with assessments via cough monitor, lab work (no fasting needed), EKG, cough questionnaires, spirometry, physical exams, vital signs and adverse event reporting Fatigue, Dizziness, dry mouth, nausea and palpitations were most common AEs reported As of 15 Jun 2016 one death has occured- not considered related to study drug (pg110 in IB) ?  Study [id number]: Adverse Events Reported of Subjects in Any Treatment Group (Preferred Terms) Table 5.4-4,5 in IB   Placebo Orevipitant 10mg        30mg          60mg  > 5% side effect    Headache 9% 0%            9%             9%  Diarrhea 5%  0%            6%               6%  Somnolence 3%  0%            9%             9%  Nausea 8%  0%             5%            5%  Dry Mouth 8% 0%             7%             7%  Fatigue 2% 0%             4%            6%  Palpitations 1%  0%             3%             1%      Rare but important: Convulsions    Seen in (3) subjects taking 60mg /day- dose has been eliminated    Clinical Research officer, political party / Research RN note : This visit for Subject Debra Cain with DOB: November 26, 1970 on 01/21/2017 for the above protocol is Visit/Encounter # Week 14 EOS  and is for purpose of research . The consent for this encounter is under Protocol Version 5.0 and is currently IRB approved. Subject confirmed that there was  No change in contact information (e.g. address, telephone, email). Subject thanked for participation in research and contribution to science.   All procedures completed per the above mentioned protocol. Subject denied any questions. Subject states she did not feel the IP helped her chronic cough. Subject advised per protocol, to continue taking her birth control for a total of 4 weeks after the last dose of IP. Last dose for this subject was on 02/Oct/18. Subject stated understanding and agrees to continue BCP for a total of 4 weeks from that date.   In this visit 01/21/2017 the subject will be evaluated by investigator named Dr. Chase Caller  . This research coordinator has verified that the investigator is uptodate with his/her training logs   Because the PI is NOT available due to schedule issues, the sub-I reported and CRC has confirmed that the PI has discussed the visit a-priori with the sub-investigator  The CRC has confirmed that the note will be routed to PI by sub-I if possible.   Signed by [place] Bing, [place], [place] Coordinator  [facility]  [place], [place] 3:42 PM 01/21/2017

## 2017-01-21 NOTE — Progress Notes (Signed)
Title: A Double- Blind, Randomized, Placebo Controlled Study of the Efficacy and Safety of Three Doses of Orvepitant in Subjects With Chronic Refractory Cough Sponsor: [employer]; Protocol Number: VOLCANO-2;  NCT Trial #: [id number]; Phase of Development: Phase 2  Synopsis: Randomized subjects 1:1:1:1 entering a three week screening period and randomized at baseline/day 1 and followed throughout a 12 week double blind dosing. Doses of orvepitant (10mg/day, 20mg/day and 30mg/day) or a placebo will be administered and evaluated in the reduction of awake objective cough frequency  Key Inclusion Criteria:  Awake cough frequency of greater than/equal to 10 coughs/hour Female/Female subjects greater than/equal to 18 yrs. Diagnosis of CRC/unexplained cough for at least 1 year Non pregnant/lactating females Two forms of birth control- for female and female subjects  Key Exclusion Criteria: Current smokers or ex smokers within 6 months Recent respiratory tract infection Malignancy in past 5 years except if in remission or skin cancers unless approved by sponsor Treatment with Angiotensin Converting Enzyme inhibitors within 3 months of screening History of drug/alcohol abuse within 12 months of screening Hx of cystic fibrosis,idiopathic pulmonary fibrosis, moderate-severe asthma, bronchiectasis, COPD, CHF Significant or unstable psychiatric condition Renal transplant, current dialysis, or Hx of renal tubular acidosis -No GFR/Cr cut off noted Uncontrolled hypertension at screening Significant neurological disorder- **prior PMH or increased risk of seizures (not febrile), or recent (within 6 months) of head trauma or loss of consciousness or concussion Recent myocardial infarction (within 1 year of screening) drug use taken solely for treatment of cough prohibited from at least 1 month prior to screening) Concomitant respiratory medication is allowed, but subjects must be stable on medication  and take it for the duration of the study IMPORTANT MEDICATIONS PATIENT CAN NOT BE ON: grapefruit juice, St. John's Wort, digoxin, diltiazem, fluconazole, verapamil, ciprofloaxcin, amiodarone, carvedilol, azithromycin ---> All from Screening until 1 week after LAST dose  Key End Points: Change from baseline to week 12 in awake objective cough frequency measured with an automated cough monitor (Vitalojak) using 10mg, 20mg, 30mg orvepitant versus placebo  Key features of Volcano 2: Orvepitant is a centrally active, highly potent and selective NK(neurokinin)-1 receptor antagonist, recognized of its potential therapy for refractory cough Orvepitant was originally investigated for Major Depressive Disorder and PTSD Half Life of 7-15 hrs after single dose and increases to 34 hours after repeated dosing  Safety of Volcano 2 based on IB Version 6.0 and Date 09 Mar 2018__: Orvepitant has been administered in phase 2 studies to 557 patients to date- generally safe and well tolerated  A top dose of 30mg has been chosen based on efficacy/safety in Volcano 1 Monitored with assessments via cough monitor, lab work (no fasting needed), EKG, cough questionnaires, spirometry, physical exams, vital signs and adverse event reporting Fatigue, Dizziness, dry mouth, nausea and palpitations were most common AEs reported As of 15 Jun 2016 one death has occured- not considered related to study drug (pg110 in IB) ?  Study [id number]: Adverse Events Reported of Subjects in Any Treatment Group (Preferred Terms) Table 5.4-4,5 in IB   Placebo Orevipitant 10mg        30mg          60mg  > 5% side effect    Headache 9% 0%            9%             9%  Diarrhea 5%  0%            6%               6%  Somnolence 3%  0%            9%             9%  Nausea 8%  0%             5%            5%  Dry Mouth 8% 0%             7%             7%  Fatigue 2% 0%             4%            6%  Palpitations 1%  0%             3%             1%      Rare but important: Convulsions    Seen in (3) subjects taking 60mg /day- dose has been eliminated          .................................Marland Kitchen  Research visit - late note by subI  See CRC notes and source doc   Dr. Brand Males, M.D., [facility].C.P Pulmonary and Critical Care Medicine Staff Physician, [place] Director - Interstitial Lung Disease  Program  Pulmonary [place] at [place], [place], [address]  Pager: [phone], If no answer or between  15:00h - 7:00h: call [phone] Telephone: [phone]

## 2017-01-23 DIAGNOSIS — H01009 Unspecified blepharitis unspecified eye, unspecified eyelid: Secondary | ICD-10-CM | POA: Diagnosis not present

## 2017-01-23 DIAGNOSIS — M3501 Sicca syndrome with keratoconjunctivitis: Secondary | ICD-10-CM | POA: Diagnosis not present

## 2017-01-23 DIAGNOSIS — H16223 Keratoconjunctivitis sicca, not specified as Sjogren's, bilateral: Secondary | ICD-10-CM | POA: Diagnosis not present

## 2017-02-05 DIAGNOSIS — D235 Other benign neoplasm of skin of trunk: Secondary | ICD-10-CM | POA: Diagnosis not present

## 2017-02-05 DIAGNOSIS — L820 Inflamed seborrheic keratosis: Secondary | ICD-10-CM | POA: Diagnosis not present

## 2017-02-05 DIAGNOSIS — L918 Other hypertrophic disorders of the skin: Secondary | ICD-10-CM | POA: Diagnosis not present

## 2017-02-05 DIAGNOSIS — L609 Nail disorder, unspecified: Secondary | ICD-10-CM | POA: Diagnosis not present

## 2017-02-05 DIAGNOSIS — L603 Nail dystrophy: Secondary | ICD-10-CM | POA: Diagnosis not present

## 2017-02-25 DIAGNOSIS — H01009 Unspecified blepharitis unspecified eye, unspecified eyelid: Secondary | ICD-10-CM | POA: Diagnosis not present

## 2017-02-25 DIAGNOSIS — M3501 Sicca syndrome with keratoconjunctivitis: Secondary | ICD-10-CM | POA: Diagnosis not present

## 2017-02-25 DIAGNOSIS — H16223 Keratoconjunctivitis sicca, not specified as Sjogren's, bilateral: Secondary | ICD-10-CM | POA: Diagnosis not present

## 2017-03-11 DIAGNOSIS — R300 Dysuria: Secondary | ICD-10-CM | POA: Diagnosis not present

## 2017-04-29 DIAGNOSIS — M3500 Sicca syndrome, unspecified: Secondary | ICD-10-CM | POA: Diagnosis not present

## 2017-05-17 DIAGNOSIS — R05 Cough: Secondary | ICD-10-CM | POA: Diagnosis not present

## 2017-05-17 DIAGNOSIS — J320 Chronic maxillary sinusitis: Secondary | ICD-10-CM | POA: Diagnosis not present

## 2017-08-23 DIAGNOSIS — H35411 Lattice degeneration of retina, right eye: Secondary | ICD-10-CM | POA: Diagnosis not present

## 2017-08-23 DIAGNOSIS — H35372 Puckering of macula, left eye: Secondary | ICD-10-CM | POA: Diagnosis not present

## 2017-08-23 DIAGNOSIS — H15831 Staphyloma posticum, right eye: Secondary | ICD-10-CM | POA: Diagnosis not present

## 2017-08-26 ENCOUNTER — Telehealth: Payer: Self-pay | Admitting: Internal Medicine

## 2017-08-26 NOTE — Telephone Encounter (Signed)
Copied from [place] [id number]. Topic: Quick Communication - Rx Refill/Question >> Aug 26, 2017 12:13 PM Aurelio Brash B wrote: Medication:sertraline (ZOLOFT) 100 MG tablet  PT STATES SHE ONLY HAS 1 PILL LEFT   Has the patient contacted their pharmacy? yes (Agent: If no, request that the patient contact the pharmacy for the refill.) (Agent: If yes, when and what did the pharmacy advise?)  Preferred Pharmacy (with phone number or street name): [facility] [id number] - [place], [place]  Agent: Please be advised that RX refills may take up to 3 business days. We ask that you follow-up with your pharmacy.

## 2017-08-27 MED ORDER — SERTRALINE HCL 100 MG PO TABS: 100.0000 mg | ORAL_TABLET | Freq: Every day | ORAL | 0 refills | Status: DC

## 2017-08-27 NOTE — Telephone Encounter (Signed)
Refill for Zoloft 100 mg tab Last office visit when this med was addressed was 07/16/16.  Dr. Quay Burow NOV  None  [facility] [phone]  [facility] Closed)

## 2017-09-05 NOTE — Progress Notes (Signed)
Subjective:    Patient ID: Debra Cain, female    DOB: 10/10/1970, 48 y.o.   MRN: [id number]  HPI The patient is here for follow up.  Anxiety: She is taking her sertraline daily as prescribed. She takes xanax only as needed - often once a month, usually less than that.  She denies any side effects from the medication. She feels her anxiety is well controlled and she is happy with her current dose of medication.    Medications and allergies reviewed with patient and updated if appropriate.  Patient Active Problem List   Diagnosis Date Noted  . Research study patient 09/24/2016  . Chronic cough 07/16/2016  . Memory change 07/16/2016  . Upper airway cough syndrome 05/17/2015  . Detached retina 12/09/2014  . Cough variant asthma 03/01/2014  . Anxiety 09/24/2011  . Choking 09/24/2011  . Chronic interstitial cystitis     No current outpatient medications on file prior to visit.   No current facility-administered medications on file prior to visit.     Past Medical History:  Diagnosis Date  . Anxiety   . Chronic cough    neuropathic, following with ENT at [facility] for selective vagal nerve blocks (fall 2016)  . Chronic interstitial cystitis   . Complication of anesthesia    slow to wake up  . Detached retina 06/2014   left eye  . GERD (gastroesophageal reflux disease)    takes pepcid    Past Surgical History:  Procedure Laterality Date  . APPENDECTOMY  07/1996  . EYE SURGERY Bilateral    Lasik   . GAS/FLUID EXCHANGE Left 06/17/2014   Procedure: GAS/FLUID EXCHANGE  C3F8;  Surgeon: Sherlynn Stalls, MD;  Location: [place];  Service: Ophthalmology;  Laterality: Left;  . Urology surgery  06/23/01  . VITRECTOMY 25 GAUGE WITH SCLERAL BUCKLE Left 06/17/2014   Procedure: VITRECTOMY 25 GAUGE WITH SCLERAL BUCKLE;  Surgeon: Sherlynn Stalls, MD;  Location: [place];  Service: Ophthalmology;  Laterality: Left;    Social History   Socioeconomic History  . Marital status: Married    Spouse  name: Not on file  . Number of children: Not on file  . Years of education: Not on file  . Highest education level: Not on file  Occupational History  . Occupation: Careers adviser   Social Needs  . Financial resource strain: Not on file  . Food insecurity:    Worry: Not on file    Inability: Not on file  . Transportation needs:    Medical: Not on file    Non-medical: Not on file  Tobacco Use  . Smoking status: Never Smoker  . Smokeless tobacco: Never Used  Substance and Sexual Activity  . Alcohol use: No  . Drug use: No  . Sexual activity: Not on file  Lifestyle  . Physical activity:    Days per week: Not on file    Minutes per session: Not on file  . Stress: Not on file  Relationships  . Social connections:    Talks on phone: Not on file    Gets together: Not on file    Attends religious service: Not on file    Active member of club or organization: Not on file    Attends meetings of clubs or organizations: Not on file    Relationship status: Not on file  Other Topics Concern  . 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 Colitis Father     Review of Systems  Constitutional: Negative for chills and fever.  Respiratory: Positive for cough. Negative for shortness of breath and wheezing.   Cardiovascular: Negative for chest pain, palpitations and leg swelling.  Neurological: Negative for light-headedness and headaches.       Objective:   Vitals:   09/06/17 1505  BP: 120/88  Pulse: 70  Resp: 16  Temp: 99 F (37.2 C)  SpO2: 98%   BP Readings from Last 3 Encounters:  09/06/17 120/88  01/21/17 112/72  11/09/16 110/68   Wt Readings from Last 3 Encounters:  09/06/17 128 lb (58.1 kg)  01/21/17 126 lb (57.2 kg)  11/09/16 129 lb (58.5 kg)   Body mass index is 21.3 kg/m.   Physical Exam    Constitutional:  Appears well-developed and well-nourished. No distress.  HENT:  Head: Normocephalic and atraumatic.  Neck: Neck supple. No tracheal deviation present. No thyromegaly present.  No cervical lymphadenopathy Cardiovascular: Normal rate, regular rhythm and normal heart sounds.   No murmur heard. No carotid bruit .  No edema Pulmonary/Chest: Effort normal and breath sounds normal. No respiratory distress. No has no wheezes. No rales.  Skin: Skin is warm and dry. Not diaphoretic.  Psychiatric: Normal mood and affect. Behavior is normal.      Assessment & Plan:    See Problem List for Assessment and Plan of chronic medical problems.

## 2017-09-06 ENCOUNTER — Ambulatory Visit: Payer: 59 | Admitting: Internal Medicine

## 2017-09-06 VITALS — BP 120/88 | HR 70 | Temp 99.0°F | Resp 16 | Wt 128.0 lb

## 2017-09-06 DIAGNOSIS — F419 Anxiety disorder, unspecified: Secondary | ICD-10-CM

## 2017-09-06 MED ORDER — ALPRAZOLAM 0.25 MG PO TABS: 0.2500 mg | ORAL_TABLET | Freq: Two times a day (BID) | ORAL | 0 refills | Status: DC | PRN

## 2017-09-06 MED ORDER — SERTRALINE HCL 100 MG PO TABS: 100.0000 mg | ORAL_TABLET | Freq: Every day | ORAL | 3 refills | Status: DC

## 2017-09-06 NOTE — Patient Instructions (Signed)
  Medications reviewed and updated.   No changes recommended at this time.  Your prescription(s) have been submitted to your pharmacy. Please take as directed and contact our office if you believe you are having problem(s) with the medication(s).    Please followup in one year

## 2017-09-07 ENCOUNTER — Encounter: Payer: Self-pay | Admitting: Internal Medicine

## 2017-09-07 NOTE — Assessment & Plan Note (Signed)
Controlled, stable Continue current dose of medication meds refilled F/u in one year

## 2017-09-29 NOTE — Patient Instructions (Signed)
Per protocol 

## 2018-01-20 LAB — HM PAP SMEAR: HM Pap smear: NORMAL

## 2018-03-14 ENCOUNTER — Other Ambulatory Visit: Payer: Self-pay

## 2018-03-14 NOTE — Telephone Encounter (Signed)
Did not see in the database where last rx was filled it looks like last one was in 2017.  Last OV was 09/06/17

## 2018-03-15 MED ORDER — ALPRAZOLAM 0.25 MG PO TABS: 0.2500 mg | ORAL_TABLET | Freq: Two times a day (BID) | ORAL | 0 refills | Status: DC | PRN

## 2018-04-27 NOTE — Progress Notes (Signed)
Subjective:    Patient ID: Debra Cain, female    DOB: 03-01-1971, 48 y.o.   MRN: [id number]  HPI She is here for an acute visit.   She continues to have a chronic cough with severe coughing fits.  When she has her coughing fits she has times of drainage and difficulty breathing that sounds like a laryngospasm.  She recently had a cold, which made her symptoms much worse.  She started taking an allergy medication and that seemed to help with some of the drainage and her cough.  She is continuing to take that.  When she has difficulty breathing she feels like she cannot take air in and she cannot exhale.  She cannot talk.  The above attacks make her anxious, but in general she is more irritable and has generalized anxiety.  She can get herself worked up very easily and make herself have a anxiety attack.  She also can get frustrated and irritable with her husband.  She was taking sertraline at one point, but stopped.  She does not want to go on a daily medication.  She does use the Xanax as needed and that helps a lot.  Most of the time she just has it on hand and does not need to take it.  She would like to get a refill of that.  Vertigo: She had vertigo recently and has had this in the past.  She does eat a lot of salt and has been told by others not eat as much salt because that can cause vertigo.  She currently denies any vertigo.  She wanted to make sure there was no other cause for the vertigo and wanted to get blood work.  She saw her gynecologist recently and he also encouraged routine blood work.  She states she typically eats what she wants when she wants and does not and has never paid attention to what she ate.  She often feels symptoms consistent with low sugar and wonders what she needed to do to help that.    Medications and allergies reviewed with patient and updated if appropriate.  Patient Active Problem List   Diagnosis Date Noted  . Research study patient 09/24/2016    . Chronic cough 07/16/2016  . Memory change 07/16/2016  . Upper airway cough syndrome 05/17/2015  . Detached retina 12/09/2014  . Cough variant asthma 03/01/2014  . Anxiety 09/24/2011  . Choking 09/24/2011  . Chronic interstitial cystitis     Current Outpatient Medications on File Prior to Visit  Medication Sig Dispense Refill  . ALPRAZolam (XANAX) 0.25 MG tablet Take 1 tablet (0.25 mg total) by mouth 2 (two) times daily as needed for anxiety. 20 tablet 0  . sertraline (ZOLOFT) 100 MG tablet Take 1 tablet (100 mg total) by mouth daily. (Patient not taking: Reported on 04/28/2018) 90 tablet 3   No current facility-administered medications on file prior to visit.     Past Medical History:  Diagnosis Date  . Anxiety   . Chronic cough    neuropathic, following with ENT at [facility] for selective vagal nerve blocks (fall 2016)  . Chronic interstitial cystitis   . Complication of anesthesia    slow to wake up  . Detached retina 06/2014   left eye  . GERD (gastroesophageal reflux disease)    takes pepcid    Past Surgical History:  Procedure Laterality Date  . APPENDECTOMY  07/1996  . EYE SURGERY Bilateral    Lasik   .  GAS/FLUID EXCHANGE Left 06/17/2014   Procedure: GAS/FLUID EXCHANGE  C3F8;  Surgeon: Sherlynn Stalls, MD;  Location: [place];  Service: Ophthalmology;  Laterality: Left;  . Urology surgery  06/23/01  . VITRECTOMY 25 GAUGE WITH SCLERAL BUCKLE Left 06/17/2014   Procedure: VITRECTOMY 25 GAUGE WITH SCLERAL BUCKLE;  Surgeon: Sherlynn Stalls, MD;  Location: [place];  Service: Ophthalmology;  Laterality: Left;    Social History   Socioeconomic History  . Marital status: Married    Spouse name: Not on file  . Number of children: Not on file  . Years of education: Not on file  . Highest education level: Not on file  Occupational History  . Occupation: Careers adviser   Social Needs  . Financial resource strain: Not on file  . Food insecurity:    Worry: Not on file    Inability: Not on  file  . Transportation needs:    Medical: Not on file    Non-medical: Not on file  Tobacco Use  . Smoking status: Never Smoker  . Smokeless tobacco: Never Used  Substance and Sexual Activity  . Alcohol use: No  . Drug use: No  . Sexual activity: Not on file  Lifestyle  . Physical activity:    Days per week: Not on file    Minutes per session: Not on file  . Stress: Not on file  Relationships  . Social connections:    Talks on phone: Not on file    Gets together: Not on file    Attends religious service: Not on file    Active member of club or organization: Not on file    Attends meetings of clubs or organizations: Not on file    Relationship status: Not on file  Other Topics Concern  . 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 Colitis Father     Review of Systems  Constitutional: Negative for chills and fever.  Respiratory: Positive for cough. Negative for shortness of breath and wheezing.   Cardiovascular: Negative for chest pain and palpitations.  Neurological: Positive for dizziness. Negative for headaches.  Psychiatric/Behavioral: The patient is nervous/anxious.        Objective:   Vitals:   04/28/18 1454  BP: (!) 142/98  Pulse: 75  Resp: 16  Temp: 98.8 F (37.1 C)  SpO2: 99%   Filed Weights   04/28/18 1454  Weight: 128 lb (58.1 kg)   Body mass index is 21.3 kg/m.  BP Readings from Last 3 Encounters:  04/28/18 (!) 142/98  09/06/17 120/88  01/21/17 112/72     Wt Readings from Last 3 Encounters:  04/28/18 128 lb (58.1 kg)  09/06/17 128 lb (58.1 kg)  01/21/17 126 lb (57.2 kg)     Physical Exam GENERAL APPEARANCE: Appears stated age, well appearing, NAD EYES: conjunctiva clear, no icterus HEENT: bilateral tympanic membranes and ear canals normal, oropharynx with mild erythema, no  thyromegaly, trachea midline, no cervical or supraclavicular lymphadenopathy LUNGS: Clear to auscultation without wheeze or crackles, unlabored breathing, good air entry bilaterally CARDIOVASCULAR: Normal S1,S2 without murmurs, no edema SKIN: warm, dry        Assessment & Plan:   See Problem List for Assessment and Plan of chronic medical problems.

## 2018-04-28 ENCOUNTER — Ambulatory Visit (INDEPENDENT_AMBULATORY_CARE_PROVIDER_SITE_OTHER): Payer: Self-pay | Admitting: Internal Medicine

## 2018-04-28 ENCOUNTER — Ambulatory Visit: Payer: 59 | Admitting: Internal Medicine

## 2018-04-28 ENCOUNTER — Other Ambulatory Visit (INDEPENDENT_AMBULATORY_CARE_PROVIDER_SITE_OTHER): Payer: Self-pay

## 2018-04-28 ENCOUNTER — Encounter

## 2018-04-28 ENCOUNTER — Encounter: Payer: Self-pay | Admitting: Internal Medicine

## 2018-04-28 VITALS — BP 142/98 | HR 75 | Temp 98.8°F | Resp 16 | Ht 65.0 in | Wt 128.0 lb

## 2018-04-28 DIAGNOSIS — E162 Hypoglycemia, unspecified: Secondary | ICD-10-CM

## 2018-04-28 DIAGNOSIS — F419 Anxiety disorder, unspecified: Secondary | ICD-10-CM

## 2018-04-28 DIAGNOSIS — R42 Dizziness and giddiness: Secondary | ICD-10-CM

## 2018-04-28 LAB — COMPREHENSIVE METABOLIC PANEL
ALT: 9 U/L (ref 0–35)
AST: 11 U/L (ref 0–37)
Albumin: 4.6 g/dL (ref 3.5–5.2)
Alkaline Phosphatase: 74 U/L (ref 39–117)
BUN: 10 mg/dL (ref 6–23)
CALCIUM: 9.4 mg/dL (ref 8.4–10.5)
CO2: 25 mEq/L (ref 19–32)
Chloride: 102 mEq/L (ref 96–112)
Creatinine, Ser: 0.6 mg/dL (ref 0.40–1.20)
GFR: 107.59 mL/min (ref >60.00)
Glucose, Bld: 80 mg/dL (ref 70–99)
Potassium: 3.8 mEq/L (ref 3.5–5.1)
Sodium: 137 mEq/L (ref 135–145)
Total Bilirubin: 0.7 mg/dL (ref 0.2–1.2)
Total Protein: 7.8 g/dL (ref 6.0–8.3)

## 2018-04-28 LAB — CBC WITH DIFFERENTIAL/PLATELET
Basophils Absolute: 0 10*3/uL (ref 0.0–0.1)
Basophils Relative: 0.5 % (ref 0.0–3.0)
Eosinophils Absolute: 0.1 10*3/uL (ref 0.0–0.7)
Eosinophils Relative: 0.8 % (ref 0.0–5.0)
HCT: 40.7 % (ref 36.0–46.0)
HEMOGLOBIN: 14 g/dL (ref 12.0–15.0)
LYMPHS PCT: 34.8 % (ref 12.0–46.0)
Lymphs Abs: 2.8 10*3/uL (ref 0.7–4.0)
MCHC: 34.4 g/dL (ref 30.0–36.0)
MCV: 82.1 fl (ref 78.0–100.0)
Monocytes Absolute: 0.8 10*3/uL (ref 0.1–1.0)
Monocytes Relative: 9.8 % (ref 3.0–12.0)
Neutro Abs: 4.3 10*3/uL (ref 1.4–7.7)
Neutrophils Relative %: 54.1 % (ref 43.0–77.0)
Platelets: 370 10*3/uL (ref 150.0–400.0)
RBC: 4.96 Mil/uL (ref 3.87–5.11)
RDW: 13.4 % (ref 11.5–15.5)
WBC: 8 10*3/uL (ref 4.0–10.5)

## 2018-04-28 LAB — TSH: TSH: 1.3 u[IU]/mL (ref 0.35–4.50)

## 2018-04-28 MED ORDER — ALPRAZOLAM 0.25 MG PO TABS: 0.2500 mg | ORAL_TABLET | Freq: Two times a day (BID) | ORAL | 0 refills | Status: DC | PRN

## 2018-04-28 NOTE — Assessment & Plan Note (Signed)
Has had more than one episode of vertigo in the past ?  BPPV versus labyrinthitis Will check CBC, CMP, TSH Decrease salt intake Increase hydration Over-the-counter vertigo medication as needed

## 2018-04-28 NOTE — Assessment & Plan Note (Signed)
She is generalized as well as situational anxiety Does not want to take a daily medication We will continue alprazolam as needed-she does not take this often Will refill

## 2018-04-28 NOTE — Assessment & Plan Note (Signed)
Has symptoms suggestive of hypoglycemia and does not eat regularly Advised that she needs to eat more regularly and to increase her protein intake CMP, CBC, TSH

## 2018-04-28 NOTE — Patient Instructions (Addendum)
Take the xanax as needed for anxiety  Continue the allergy medication.     Have blood work done today.

## 2018-05-16 ENCOUNTER — Encounter: Payer: Self-pay | Admitting: Internal Medicine

## 2018-08-18 DIAGNOSIS — H5213 Myopia, bilateral: Secondary | ICD-10-CM | POA: Diagnosis not present

## 2018-11-23 ENCOUNTER — Other Ambulatory Visit: Payer: Self-pay | Admitting: Internal Medicine

## 2018-11-25 NOTE — Progress Notes (Signed)
Virtual Visit via Video Note  I connected with Debra Cain on 11/26/18 at  3:45 PM EDT by a video enabled telemedicine application and verified that I am speaking with the correct person using two identifiers.   I discussed the limitations of evaluation and management by telemedicine and the availability of in person appointments. The patient expressed understanding and agreed to proceed.  The patient is currently at home and I am in the office.    No referring provider.    History of Present Illness: She is here for follow up of her chronic medical conditions.     Anxiety: She has some generalized anxiety as well as situationally slightly.  She was on Zoloft in the past, but wanted to stop it because it caused weight gain and she felt like it did not work that well-she still had anxiety.  She takes Xanax as needed, but tries not to take it.  She does not take it often.  On July 4 she had a bad panic attack and ended up in the emergency room.  Work-up there was all negative.  She did follow-up with her gynecologist and further blood work and assessment there showed everything was normal.  She is unsure if she should go back on a daily medication or continue the alprazolam.  She did need to take the alprazolam for short time.  To help control her anxiety.  Some of the medication she was on in the past she does not want to take.  She would for not to be on any medication.   She denies any chest pain, palpitations or difficulty breathing with anxiety at this time.  Right now she feels her anxiety is okay, but the anxiety will come in waves.  She is not having any depression.  Social History   Socioeconomic History  . Marital status: Married    Spouse name: Not on file  . Number of children: Not on file  . Years of education: Not on file  . Highest education level: Not on file  Occupational History  . Occupation: Careers adviser   Social Needs  . Financial resource strain: Not on file  .  Food insecurity    Worry: Not on file    Inability: Not on file  . Transportation needs    Medical: Not on file    Non-medical: Not on file  Tobacco Use  . Smoking status: Never Smoker  . Smokeless tobacco: Never Used  Substance and Sexual Activity  . Alcohol use: No  . Drug use: No  . Sexual activity: Not on file  Lifestyle  . Physical activity    Days per week: Not on file    Minutes per session: Not on file  . Stress: Not on file  Relationships  . Social Herbalist on phone: Not on file    Gets together: Not on file    Attends religious service: Not on file    Active member of club or organization: Not on file    Attends meetings of clubs or organizations: Not on file    Relationship status: Not on file  Other Topics Concern  . 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  Thought process and judgment normal.  Assessment and Plan:  See Problem List for Assessment and Plan of chronic medical problems.   Follow Up Instructions:    I discussed the assessment and treatment plan with the patient. The patient was provided an opportunity to ask questions and all were answered. The patient agreed with the plan and demonstrated an understanding of the instructions.   The patient was advised to call back or seek an in-person evaluation if the symptoms worsen or if the condition fails to improve as anticipated.    Binnie Rail, MD

## 2018-11-26 ENCOUNTER — Encounter: Payer: Self-pay | Admitting: Internal Medicine

## 2018-11-26 ENCOUNTER — Ambulatory Visit (INDEPENDENT_AMBULATORY_CARE_PROVIDER_SITE_OTHER): Payer: 59 | Admitting: Internal Medicine

## 2018-11-26 DIAGNOSIS — F419 Anxiety disorder, unspecified: Secondary | ICD-10-CM | POA: Diagnosis not present

## 2018-11-26 MED ORDER — ALPRAZOLAM 0.25 MG PO TABS: 0.2500 mg | ORAL_TABLET | Freq: Two times a day (BID) | ORAL | 0 refills | Status: DC | PRN

## 2018-11-26 NOTE — Assessment & Plan Note (Signed)
Continues to have some generalized anxiety, but flares of anxiety. Had a recent severe panic attack Continue alprazolam as needed She is still trying to determine if she wants to be on a daily medication or not-we will hold off for now, but if she changes her mind she will let me know-we will consider very low-dose Paxil Xanax refilled

## 2018-11-28 ENCOUNTER — Telehealth: Payer: Self-pay

## 2018-11-28 MED ORDER — TRAZODONE HCL 50 MG PO TABS: 25.0000 mg | ORAL_TABLET | Freq: Every evening | ORAL | 3 refills | Status: DC | PRN

## 2018-11-28 NOTE — Telephone Encounter (Signed)
Prescription sent to pharmacy.  Please call her and see if she has had any side effects.  We can increase the dose if needed.

## 2018-11-28 NOTE — Telephone Encounter (Signed)
Pt states she has been dealing with this for years and has tried several OTC meds to help with sleep. Melatonin will not work for her. States she gets to where she stays up until 3 am and then has to get right back up. Would like an rx sent to [facility] listed on file.

## 2018-11-28 NOTE — Telephone Encounter (Signed)
Ideally something natural would be best - melatonin or sleepy time tea.   I those do not work we could try a prescription called trazodone - it is an older anti-depressant that is only used for sleep  - it is the best prescription option b/c it is not addicting.

## 2018-11-28 NOTE — Telephone Encounter (Signed)
Copied from [place] [id number]. Topic: General - Other >> Nov 27, 2018 12:41 PM Alanda Slim E wrote: Reason for CRM: Yesterday Pt forgot to address the issue about her not being able to sleep at night. Pt stated it has been going on for a while and she tried to just deal with it. But she wanted to speak with the nurse or Dr. Quay Burow about it and what the might advise/ please advise

## 2018-12-01 NOTE — Telephone Encounter (Signed)
Pt aware and expressed understanding.  

## 2019-09-21 ENCOUNTER — Other Ambulatory Visit: Payer: Self-pay | Admitting: Internal Medicine

## 2019-09-21 NOTE — Telephone Encounter (Signed)
Check [place] registry last filled 11/26/2018.Marland KitchenJohny Cain

## 2019-10-19 ENCOUNTER — Ambulatory Visit: Payer: 59 | Admitting: Internal Medicine

## 2019-10-19 ENCOUNTER — Encounter: Payer: Self-pay | Admitting: Internal Medicine

## 2019-10-19 ENCOUNTER — Other Ambulatory Visit: Payer: Self-pay

## 2019-10-19 DIAGNOSIS — R531 Weakness: Secondary | ICD-10-CM | POA: Diagnosis not present

## 2019-10-19 DIAGNOSIS — F419 Anxiety disorder, unspecified: Secondary | ICD-10-CM

## 2019-10-19 MED ORDER — SERTRALINE HCL 50 MG PO TABS: 50.0000 mg | ORAL_TABLET | Freq: Every day | ORAL | 5 refills | Status: DC

## 2019-10-19 NOTE — Assessment & Plan Note (Signed)
Acute Related to recent food poisoning Probably slightly dehydrated and has not eaten in a week Stressed continuing increase fluids Stressed trying to eat something and if she does not feel like it to improve her strength She did go to urgent care earlier today and hopes to get fluids, but they did not have any and only prescribe Zofran-use the Zofran as needed

## 2019-10-19 NOTE — Assessment & Plan Note (Signed)
Chronic Acute on chronic with more severe anxiety at this time and borderline panic attacks Taking alprazolam twice daily-we will continue She has been on a few different SSRIs in the past and neither they were not effective or caused side effects Given the severity of her anxiety she agrees to restart 1-restart sertraline 50 mg daily She does have a therapist and she will follow-up with her Follow-up with me in 1 month, sooner if needed

## 2019-10-19 NOTE — Progress Notes (Signed)
Subjective:    Patient ID: Debra Cain, female    DOB: [birth year], 49 y.o.   MRN: [id number]  HPI The patient is here for an acute visit.  She has a lot going on. Food poisoning over one week ago and has not eaten since that started.  Vomiting, diarrhea - clear water.  That has stopped, but she is still incredibly weak and has no appetite.  She does not want to think about eating.  She denies any true nausea.  She is drinking fluids and urinating, but just knows she is not drinking enough.  She is not sleeping.   Her anxiety is so high and she can not get it controlled.  She keeps having near panic attacks  Marriage is not good,, had to put two dogs down last Friday.     She does not 1 herself, but she wishes she could go to sleep and just get away from it all.  Medications and allergies reviewed with patient and updated if appropriate.  Patient Active Problem List   Diagnosis Date Noted  . Vertigo 04/28/2018  . Hypoglycemia 04/28/2018  . Research study patient 09/24/2016  . Chronic cough 07/16/2016  . Memory change 07/16/2016  . Upper airway cough syndrome 05/17/2015  . Detached retina 12/09/2014  . Cough variant asthma 03/01/2014  . Anxiety 09/24/2011  . Choking 09/24/2011  . Chronic interstitial cystitis     Current Outpatient Medications on File Prior to Visit  Medication Sig Dispense Refill  . ALPRAZolam (XANAX) 0.25 MG tablet TAKE 1 TABLET(0.25 MG) BY MOUTH TWICE DAILY AS NEEDED FOR ANXIETY 30 tablet 0  . ondansetron (ZOFRAN-ODT) 4 MG disintegrating tablet Take by mouth.    . traZODone (DESYREL) 50 MG tablet Take 0.5-1 tablets (25-50 mg total) by mouth at bedtime as needed for sleep. (Patient not taking: Reported on 10/19/2019) 30 tablet 3   No current facility-administered medications on file prior to visit.    Past Medical History:  Diagnosis Date  . Anxiety   . Chronic cough    neuropathic, following with ENT at [facility] for selective vagal nerve blocks (fall  2016)  . Chronic interstitial cystitis   . Complication of anesthesia    slow to wake up  . Detached retina 06/2014   left eye  . GERD (gastroesophageal reflux disease)    takes pepcid    Past Surgical History:  Procedure Laterality Date  . APPENDECTOMY  07/1996  . EYE SURGERY Bilateral    Lasik   . GAS/FLUID EXCHANGE Left 06/17/2014   Procedure: GAS/FLUID EXCHANGE  C3F8;  Surgeon: Sherlynn Stalls, MD;  Location: [place];  Service: Ophthalmology;  Laterality: Left;  . Urology surgery  06/23/01  . VITRECTOMY 25 GAUGE WITH SCLERAL BUCKLE Left 06/17/2014   Procedure: VITRECTOMY 25 GAUGE WITH SCLERAL BUCKLE;  Surgeon: Sherlynn Stalls, MD;  Location: [place];  Service: Ophthalmology;  Laterality: Left;    Social History   Socioeconomic History  . Marital status: Married    Spouse name: Not on file  . Number of children: Not on file  . Years of education: Not on file  . Highest education level: Not on file  Occupational History  . Occupation: Stylist   Tobacco Use  . Smoking status: Never Smoker  . Smokeless tobacco: Never Used  Substance and Sexual Activity  . Alcohol use: No  . Drug use: No  . Sexual activity: Not on file  Other Topics Concern  . 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 Difficulty of Paying Living Expenses:   Food Insecurity:   . Worried About Charity fundraiser in the Last Year:   . Arboriculturist in the Last Year:   Transportation Needs:   . Film/video editor (Medical):   Marland Kitchen Lack of Transportation (Non-Medical):   Physical Activity:   . Days of Exercise per Week:   . Minutes of Exercise per Session:   Stress:   . Feeling of Stress :   Social Connections:   . Frequency of Communication with Friends and Family:   . Frequency of Social Gatherings with Friends and Family:   . Attends  Religious Services:   . Active Member of Clubs or Organizations:   . Attends Archivist Meetings:   Marland Kitchen Marital Status:     Family History  Problem Relation Age of Onset  . Colitis Father     Review of Systems  Constitutional: Positive for appetite change (decreased).  Gastrointestinal: Negative for nausea.  Psychiatric/Behavioral: The patient is nervous/anxious.        Objective:   Vitals:   10/19/19 1608  BP: 124/70  Pulse: 92  Temp: 98.3 F (36.8 C)  SpO2: 98%   BP Readings from Last 3 Encounters:  10/19/19 124/70  04/28/18 (!) 142/98  09/06/17 120/88   Wt Readings from Last 3 Encounters:  10/19/19 115 lb (52.2 kg)  04/28/18 128 lb (58.1 kg)  09/06/17 128 lb (58.1 kg)   Body mass index is 19.14 kg/m.   Physical Exam Constitutional:      General: She is not in acute distress.    Appearance: Normal appearance. She is not ill-appearing.  HENT:     Head: Normocephalic and atraumatic.  Skin:    General: Skin is warm and dry.  Neurological:     Mental Status: She is alert.  Psychiatric:        Behavior: Behavior normal.        Thought Content: Thought content normal.        Judgment: Judgment normal.     Comments: Anxious and depressed mood            Assessment & Plan:    See Problem List for Assessment and Plan of chronic medical problems.    This visit occurred during the SARS-CoV-2 public health emergency.  Safety protocols were in place, including screening questions prior to the visit, additional usage of staff PPE, and extensive cleaning of exam room while observing appropriate contact time as indicated for disinfecting solutions.

## 2019-10-19 NOTE — Patient Instructions (Addendum)
Restart sertraline 50 mg nightly.     Follow up in one month, sooner if needed.

## 2019-11-26 NOTE — Progress Notes (Signed)
Subjective:    Patient ID: Debra Cain, female    DOB: September 24, 1970, 49 y.o.   MRN: [id number]  HPI The patient is here for follow up of anxiety  Increased anxiety - sertraline restarted one month ago - here for follow up.    She is eating normally.  She is sleeping fairly good.  She feels her current dose of zoloft is good.  She is in marriage counseling with her husband but does not feel like it will help.  She overall feels much better and just wants to continue her current meds.   Medications and allergies reviewed with patient and updated if appropriate.  Patient Active Problem List   Diagnosis Date Noted  . Generalized weakness 10/19/2019  . Vertigo 04/28/2018  . Hypoglycemia 04/28/2018  . Research study patient 09/24/2016  . Chronic cough 07/16/2016  . Memory change 07/16/2016  . Upper airway cough syndrome 05/17/2015  . Detached retina 12/09/2014  . Cough variant asthma 03/01/2014  . Anxiety 09/24/2011  . Choking 09/24/2011  . Chronic interstitial cystitis     Current Outpatient Medications on File Prior to Visit  Medication Sig Dispense Refill  . ALPRAZolam (XANAX) 0.25 MG tablet TAKE 1 TABLET(0.25 MG) BY MOUTH TWICE DAILY AS NEEDED FOR ANXIETY 30 tablet 0  . ondansetron (ZOFRAN-ODT) 4 MG disintegrating tablet Take 4 mg by mouth every 8 (eight) hours as needed.    . sertraline (ZOLOFT) 50 MG tablet Take 1 tablet (50 mg total) by mouth daily. 30 tablet 5   No current facility-administered medications on file prior to visit.    Past Medical History:  Diagnosis Date  . Anxiety   . Chronic cough    neuropathic, following with ENT at [facility] for selective vagal nerve blocks (fall 2016)  . Chronic interstitial cystitis   . Complication of anesthesia    slow to wake up  . Detached retina 06/2014   left eye  . GERD (gastroesophageal reflux disease)    takes pepcid    Past Surgical History:  Procedure Laterality Date  . APPENDECTOMY  07/1996  . EYE SURGERY  Bilateral    Lasik   . GAS/FLUID EXCHANGE Left 06/17/2014   Procedure: GAS/FLUID EXCHANGE  C3F8;  Surgeon: Sherlynn Stalls, MD;  Location: [facility];  Service: Ophthalmology;  Laterality: Left;  . Urology surgery  06/23/01  . VITRECTOMY 25 GAUGE WITH SCLERAL BUCKLE Left 06/17/2014   Procedure: VITRECTOMY 25 GAUGE WITH SCLERAL BUCKLE;  Surgeon: Sherlynn Stalls, MD;  Location: [place] [facility];  Service: Ophthalmology;  Laterality: Left;    Social History   Socioeconomic History  . Marital status: Married    Spouse name: Not on file  . Number of children: Not on file  . Years of education: Not on file  . Highest education level: Not on file  Occupational History  . Occupation: Stylist   Tobacco Use  . Smoking status: Never Smoker  . Smokeless tobacco: Never Used  Substance and Sexual Activity  . Alcohol use: No  . Drug use: No  . Sexual activity: Not on file  Other Topics Concern  . 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 Difficulty of Paying Living Expenses: Not on file  Food Insecurity:   . Worried About [employer] of  Food in the Last Year: Not on file  . Ran Out of Food in the Last Year: Not on file  Transportation Needs:   . Lack of Transportation (Medical): Not on file  . Lack of Transportation (Non-Medical): Not on file  Physical Activity:   . Days of Exercise per Week: Not on file  . Minutes of Exercise per Session: Not on file  Stress:   . Feeling of Stress : Not on file  Social Connections:   . Frequency of Communication with Friends and Family: Not on file  . Frequency of Social Gatherings with Friends and Family: Not on file  . Attends Religious Services: Not on file  . Active Member of Clubs or Organizations: Not on file  . Attends Archivist Meetings: Not on file  . Marital Status: Not on file     Family History  Problem Relation Age of Onset  . Colitis Father     Review of Systems     Objective:   Vitals:   11/27/19 1326  BP: 120/82  Pulse: 82  Temp: 97.9 F (36.6 C)  SpO2: 99%   BP Readings from Last 3 Encounters:  11/27/19 120/82  10/19/19 124/70  04/28/18 (!) 142/98   Wt Readings from Last 3 Encounters:  11/27/19 113 lb (51.3 kg)  10/19/19 115 lb (52.2 kg)  04/28/18 128 lb (58.1 kg)   Body mass index is 18.8 kg/m.   Physical Exam    Constitutional: Appears well-developed and well-nourished. No distress.  Psychiatric: Normal mood and affect. Behavior is normal.      Assessment & Plan:    See Problem List for Assessment and Plan of chronic medical problems.    This visit occurred during the SARS-CoV-2 public health emergency.  Safety protocols were in place, including screening questions prior to the visit, additional usage of staff PPE, and extensive cleaning of exam room while observing appropriate contact time as indicated for disinfecting solutions.

## 2019-11-27 ENCOUNTER — Ambulatory Visit: Payer: 59 | Admitting: Internal Medicine

## 2019-11-27 ENCOUNTER — Encounter: Payer: Self-pay | Admitting: Internal Medicine

## 2019-11-27 ENCOUNTER — Other Ambulatory Visit: Payer: Self-pay

## 2019-11-27 VITALS — BP 120/82 | HR 82 | Temp 97.9°F | Ht 65.0 in | Wt 113.0 lb

## 2019-11-27 DIAGNOSIS — F419 Anxiety disorder, unspecified: Secondary | ICD-10-CM

## 2019-11-27 NOTE — Patient Instructions (Signed)
    Medications reviewed and updated.  Changes include :   none    Please followup in 12 months sooner if needed

## 2019-11-27 NOTE — Assessment & Plan Note (Signed)
Chronic Much improved after restarting sertraline 50 mg daily -- this is a good dose for her - continue Xanax as needed F/u in one year sooner if needed

## 2020-02-16 ENCOUNTER — Other Ambulatory Visit: Payer: Self-pay | Admitting: Internal Medicine

## 2020-03-10 ENCOUNTER — Telehealth: Payer: Self-pay | Admitting: Internal Medicine

## 2020-03-10 MED ORDER — SERTRALINE HCL 100 MG PO TABS: 100.0000 mg | ORAL_TABLET | Freq: Every day | ORAL | 1 refills | Status: DC

## 2020-03-10 NOTE — Telephone Encounter (Signed)
Patient is requesting a med refill for sertraline (ZOLOFT) 100 MG tablet It has be been D/C from her current med list. The medication that she is on is a 50mg  tablet she said the last time she got a refill it was for the 100mg . She said it could be sent to [place], [place]

## 2020-03-10 NOTE — Telephone Encounter (Signed)
sent 

## 2020-04-08 ENCOUNTER — Other Ambulatory Visit: Payer: Self-pay | Admitting: Internal Medicine

## 2020-05-16 ENCOUNTER — Other Ambulatory Visit: Payer: Self-pay | Admitting: Internal Medicine

## 2020-09-28 ENCOUNTER — Other Ambulatory Visit: Payer: Self-pay | Admitting: Internal Medicine

## 2020-11-03 NOTE — Patient Instructions (Addendum)
Medications changes include :  none   Your prescription(s) have been submitted to your pharmacy. Please take as directed and contact our office if you believe you are having problem(s) with the medication(s).    Please followup in 1 year    Health Maintenance, Female Adopting a healthy lifestyle and getting preventive care are important in promoting health and wellness. Ask your health care provider about: The right schedule for you to have regular tests and exams. Things you can do on your own to prevent diseases and keep yourself healthy. What should I know about diet, weight, and exercise? Eat a healthy diet  Eat a diet that includes plenty of vegetables, fruits, low-fat dairy products, and lean protein. Do not eat a lot of foods that are high in solid fats, added sugars, or sodium.  Maintain a healthy weight Body mass index (BMI) is used to identify weight problems. It estimates body fat based on height and weight. Your health care provider can help determineyour BMI and help you achieve or maintain a healthy weight. Get regular exercise Get regular exercise. This is one of the most important things you can do for your health. Most adults should: Exercise for at least 150 minutes each week. The exercise should increase your heart rate and make you sweat (moderate-intensity exercise). Do strengthening exercises at least twice a week. This is in addition to the moderate-intensity exercise. Spend less time sitting. Even light physical activity can be beneficial. Watch cholesterol and blood lipids Have your blood tested for lipids and cholesterol at 49 years of age, then havethis test every 5 years. Have your cholesterol levels checked more often if: Your lipid or cholesterol levels are high. You are older than 49 years of age. You are at high risk for heart disease. What should I know about cancer screening? Depending on your health history and family history, you may need to  have cancer screening at various ages. This may include screening for: Breast cancer. Cervical cancer. Colorectal cancer. Skin cancer. Lung cancer. What should I know about heart disease, diabetes, and high blood pressure? Blood pressure and heart disease High blood pressure causes heart disease and increases the risk of stroke. This is more likely to develop in people who have high blood pressure readings, are of African descent, or are overweight. Have your blood pressure checked: Every 3-5 years if you are 59-45 years of age. Every year if you are 48 years old or older. Diabetes Have regular diabetes screenings. This checks your fasting blood sugar level. Have the screening done: Once every three years after age 69 if you are at a normal weight and have a low risk for diabetes. More often and at a younger age if you are overweight or have a high risk for diabetes. What should I know about preventing infection? Hepatitis B If you have a higher risk for hepatitis B, you should be screened for this virus. Talk with your health care provider to find out if you are at risk forhepatitis B infection. Hepatitis C Testing is recommended for: Everyone born from [birth year] through 1964. Anyone with known risk factors for hepatitis C. Sexually transmitted infections (STIs) Get screened for STIs, including gonorrhea and chlamydia, if: You are sexually active and are younger than 49 years of age. You are older than 49 years of age and your health care provider tells you that you are at risk for this type of infection. Your sexual activity has changed since you were last  screened, and you are at increased risk for chlamydia or gonorrhea. Ask your health care provider if you are at risk. Ask your health care provider about whether you are at high risk for HIV. Your health care provider may recommend a prescription medicine to help prevent HIV infection. If you choose to take medicine to prevent HIV, you  should first get tested for HIV. You should then be tested every 3 months for as long as you are taking the medicine. Pregnancy If you are about to stop having your period (premenopausal) and you may become pregnant, seek counseling before you get pregnant. Take 400 to 800 micrograms (mcg) of folic acid every day if you become pregnant. Ask for birth control (contraception) if you want to prevent pregnancy. Osteoporosis and menopause Osteoporosis is a disease in which the bones lose minerals and strength with aging. This can result in bone fractures. If you are 51 years old or older, or if you are at risk for osteoporosis and fractures, ask your health care provider if you should: Be screened for bone loss. Take a calcium or vitamin D supplement to lower your risk of fractures. Be given hormone replacement therapy (HRT) to treat symptoms of menopause. Follow these instructions at home: Lifestyle Do not use any products that contain nicotine or tobacco, such as cigarettes, e-cigarettes, and chewing tobacco. If you need help quitting, ask your health care provider. Do not use street drugs. Do not share needles. Ask your health care provider for help if you need support or information about quitting drugs. Alcohol use Do not drink alcohol if: Your health care provider tells you not to drink. You are pregnant, may be pregnant, or are planning to become pregnant. If you drink alcohol: Limit how much you use to 0-1 drink a day. Limit intake if you are breastfeeding. Be aware of how much alcohol is in your drink. In the [place] one drink equals one 12 oz bottle of beer (355 mL), one 5 oz glass of wine (148 mL), or one 1 oz glass of hard liquor (44 mL). General instructions Schedule regular health, dental, and eye exams. Stay current with your vaccines. Tell your health care provider if: You often feel depressed. You have ever been abused or do not feel safe at home. Summary Adopting a healthy  lifestyle and getting preventive care are important in promoting health and wellness. Follow your health care provider's instructions about healthy diet, exercising, and getting tested or screened for diseases. Follow your health care provider's instructions on monitoring your cholesterol and blood pressure. This information is not intended to replace advice given to you by your health care provider. Make sure you discuss any questions you have with your healthcare provider. Document Revised: 03/19/2018 Document Reviewed: 03/19/2018 Elsevier Patient Education  2022 [employer].

## 2020-11-03 NOTE — Progress Notes (Signed)
Subjective:    Patient ID: Debra Cain, female    DOB: July 05, 1970, 50 y.o.   MRN: [id number]   This visit occurred during the SARS-CoV-2 public health emergency.  Safety protocols were in place, including screening questions prior to the visit, additional usage of staff PPE, and extensive cleaning of exam room while observing appropriate contact time as indicated for disinfecting solutions.    HPI She is here for a physical exam.   She thinks she had covid about 6 months ago - everything tastes and smells like beefy jerky.  Her hair has fallen and has not recovered.   She has separated from her husband.  There is still stress.     Medications and allergies reviewed with patient and updated if appropriate.  Patient Active Problem List   Diagnosis Date Noted   Generalized weakness 10/19/2019   Vertigo 04/28/2018   Hypoglycemia 04/28/2018   Chronic cough 07/16/2016   Memory change 07/16/2016   Upper airway cough syndrome 05/17/2015   Detached retina 12/09/2014   Cough variant asthma 03/01/2014   Anxiety 09/24/2011   Choking 09/24/2011   Chronic interstitial cystitis     Current Outpatient Medications on File Prior to Visit  Medication Sig Dispense Refill   iron polysaccharides (NIFEREX) 150 MG capsule Take 150 mg by mouth daily.     No current facility-administered medications on file prior to visit.    Past Medical History:  Diagnosis Date   Anxiety    Chronic cough    neuropathic, following with ENT at [facility] for selective vagal nerve blocks (fall 2016)   Chronic interstitial cystitis    Complication of anesthesia    slow to wake up   Detached retina 06/2014   left eye   GERD (gastroesophageal reflux disease)    takes pepcid    Past Surgical History:  Procedure Laterality Date   APPENDECTOMY  07/1996   EYE SURGERY Bilateral    Lasik    GAS/FLUID EXCHANGE Left 06/17/2014   Procedure: GAS/FLUID EXCHANGE  C3F8;  Surgeon: Sherlynn Stalls, MD;  Location: [facility];   Service: Ophthalmology;  Laterality: Left;   Urology surgery  06/23/01   VITRECTOMY 25 GAUGE WITH SCLERAL BUCKLE Left 06/17/2014   Procedure: VITRECTOMY 25 GAUGE WITH SCLERAL BUCKLE;  Surgeon: Sherlynn Stalls, MD;  Location: [place];  Service: Ophthalmology;  Laterality: Left;    Social History   Socioeconomic History   Marital status: Married    Spouse name: Not on file   Number of children: Not on file   Years of education: Not on file   Highest education level: Not on file  Occupational History   Occupation: Stylist   Tobacco Use   Smoking status: Never   Smokeless tobacco: Never  Substance and Sexual Activity   Alcohol use: No   Drug use: No   Sexual activity: Not on file  Other Topics Concern   Not on file  Social History Narrative   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  Eyes:  Negative for visual disturbance.  Respiratory:  Positive for cough (chronic). Negative for shortness of breath and wheezing.   Cardiovascular:  Negative for chest pain, palpitations and leg swelling.  Gastrointestinal:  Negative for abdominal pain, blood in stool, constipation, diarrhea and nausea.  Genitourinary:  Negative for dysuria.  Musculoskeletal:  Negative for arthralgias and back pain.  Skin:  Negative for rash.  Neurological:  Negative for light-headedness and headaches.  Psychiatric/Behavioral:  Positive for dysphoric mood. The patient is nervous/anxious.       Objective:   Vitals:   11/04/20 1346  BP: 106/74  Pulse: 97  Temp: 98.9 F (37.2 C)  SpO2: 97%    Filed Weights   11/04/20 1346  Weight: 112 lb 3.2 oz (50.9 kg)   Body mass index is 18.67 kg/m.  BP Readings from Last 3 Encounters:  11/04/20 106/74  11/27/19 120/82  10/19/19 124/70    Wt Readings from Last 3 Encounters:  11/04/20 112 lb 3.2 oz (50.9 kg)  11/27/19 113 lb (51.3 kg)  10/19/19 115 lb (52.2 kg)     Physical Exam Constitutional: She appears well-developed and well-nourished. No distress.  HENT:  Head: Normocephalic and atraumatic.  Right Ear: External ear normal. Normal ear canal and TM Left Ear: External ear normal.  Normal ear canal and TM Mouth/Throat: Oropharynx is clear and moist.  Eyes: Conjunctivae and EOM are normal.  Neck: Neck supple. No tracheal deviation present. No thyromegaly present.  No carotid bruit  Cardiovascular: Normal rate, regular rhythm and normal heart sounds.   No murmur heard.  No edema. Pulmonary/Chest: Effort normal and breath sounds normal. No respiratory distress. She has no wheezes. She has no rales.  Breast: deferred   Abdominal: Soft. She exhibits no distension. There is no tenderness.  Lymphadenopathy: She has no cervical adenopathy.  Skin: Skin is warm and dry. She is not diaphoretic.  Psychiatric: She has a normal mood and affect. Her behavior is normal.        Assessment & Plan:   Physical exam: Screening blood work  deferred by patient Exercise  regular Weight  normal Substance abuse  none   Health Maintenance  Topic Date Due   COVID-19 Vaccine (1) 11/20/2020 (Originally 03/29/1976)   COLONOSCOPY (Pts 45-3yr Insurance coverage will need to be confirmed)  11/04/2021 (Originally 03/29/2017)   TETANUS/TDAP  11/04/2021 (Originally 07/31/2014)   INFLUENZA VACCINE  11/07/2020   PAP SMEAR-Modifier  01/20/2021   Pneumococcal Vaccine [id number] old  Aged Out   HPV VACCINES  Aged Out   Hepatitis C Screening  Discontinued   HIV Screening  Discontinued          See Problem List for Assessment and Plan of  chronic medical problems.

## 2020-11-04 ENCOUNTER — Encounter: Payer: Self-pay | Admitting: Internal Medicine

## 2020-11-04 ENCOUNTER — Ambulatory Visit (INDEPENDENT_AMBULATORY_CARE_PROVIDER_SITE_OTHER): Payer: 59 | Admitting: Internal Medicine

## 2020-11-04 ENCOUNTER — Other Ambulatory Visit: Payer: Self-pay

## 2020-11-04 VITALS — BP 106/74 | HR 97 | Temp 98.9°F | Ht 65.0 in | Wt 112.2 lb

## 2020-11-04 DIAGNOSIS — Z0000 Encounter for general adult medical examination without abnormal findings: Secondary | ICD-10-CM

## 2020-11-04 DIAGNOSIS — F419 Anxiety disorder, unspecified: Secondary | ICD-10-CM | POA: Diagnosis not present

## 2020-11-04 MED ORDER — SERTRALINE HCL 100 MG PO TABS: ORAL_TABLET | ORAL | 3 refills | Status: DC

## 2020-11-04 MED ORDER — ALPRAZOLAM 0.25 MG PO TABS: ORAL_TABLET | ORAL | 5 refills | Status: DC

## 2020-11-04 NOTE — Assessment & Plan Note (Signed)
Chronic Controlled, stable Continue sertraline 100 mg qd, xanax 0.25 mg daily prn

## 2020-11-16 ENCOUNTER — Encounter: Payer: Self-pay | Admitting: Emergency Medicine

## 2020-11-16 ENCOUNTER — Ambulatory Visit
Admission: EM | Admit: 2020-11-16 | Discharge: 2020-11-16 | Disposition: A | Discharge status: Home / Self Care | Payer: 59 | Attending: Emergency Medicine | Admitting: Emergency Medicine

## 2020-11-16 ENCOUNTER — Other Ambulatory Visit: Payer: Self-pay

## 2020-11-16 DIAGNOSIS — Z1152 Encounter for screening for COVID-19: Secondary | ICD-10-CM

## 2020-11-16 DIAGNOSIS — B349 Viral infection, unspecified: Secondary | ICD-10-CM | POA: Diagnosis not present

## 2020-11-16 MED ORDER — LIDOCAINE VISCOUS HCL 2 % MT SOLN: 15.0000 mL | OROMUCOSAL | 0 refills | Status: DC | PRN

## 2020-11-16 NOTE — Discharge Instructions (Addendum)
We will call you with any positive results from your COVID-19 testing completed in clinic today.  If you do not receive a phone call from [place] within the next 2-3 days, check your MyChart for up-to-date health information related to testing completed in clinic today.   For most people this is a self-limiting process and can take anywhere from 7 - 10 days to start feeling better. A cough can last up to 3 weeks. Pay special attention to handwashing as this can help prevent the spread of the virus.   Always read the labels of cough and cold medications as they may contain some of the ingredients below.  Rest, push lots of fluids (especially water), and utilize supportive care for symptoms. Use viscous lidocaine as directed and Zyrtec once daily You may take acetaminophen (Tylenol) every 4-6 hours and ibuprofen every 6-8 hours for muscle pain, joint pain, headaches (you may also alternate these medications). Mucinex (guaifenesin) may be taken over the counter for cough as needed can loosen phlegm. Please read the instructions and take as directed.  Sudafed (pseudophedrine) is sold behind the counter and can help reduce nasal pressure; avoid taking this if you have high blood pressure or feel jittery. Sudafed PE (phenylephrine) can be a helpful, short-term, over-the-counter alternative to limit side effects or if you have high blood pressure.  Flonase nasal spray can help alleviate congestion and sinus pressure. Many patients choose Afrin as a nasal decongestant; do not use for more than 3 days for risk of rebound (increased symptoms after stopping medication).  Saline nasal sprays or rinses can also help nasal congestion (use bottled or sterile water). Warm tea with lemon and honey can sooth sore throat and cough, as can cough drops.   Return to clinic for high fever not improving with medications, chest pain, difficulty breathing, non-stop vomiting, or coughing blood. Follow-up with your primary care  provider if symptoms do not improve as expected in the next 5-7 days.

## 2020-11-16 NOTE — ED Triage Notes (Signed)
Pt here with sore throat, lost voice, left ear pain, post nasal drip since yesterday. No fevers, but had chills last night.

## 2020-11-16 NOTE — ED Provider Notes (Signed)
CHIEF COMPLAINT:   Chief Complaint  Patient presents with   Sore Throat   Otalgia     SUBJECTIVE/HPI:   Sore Throat  Otalgia [name] presents today with sore throat, left ear pain, loss of voice, postnasal drip onset yesterday.  She also endorses some chills last night.  Patient states that this morning she was having pain with speaking.  Patient reports the use of DayQuil and NyQuil which has not seemed to help.  She reports no known sick contacts. Patient does not report any shortness of breath, chest pain, palpitations, visual changes, weakness, tingling, headache, nausea, vomiting, diarrhea, chills.   has a past medical history of Anxiety, Chronic cough, Chronic interstitial cystitis, Complication of anesthesia, Detached retina (06/2014), and GERD (gastroesophageal reflux disease).  ROS:  Review of Systems  HENT:  Positive for ear pain.   See Subjective/HPI Medications, Allergies and Problem List personally reviewed in Epic today OBJECTIVE:   Vitals:   11/16/20 0835  BP: 131/86  Pulse: 86  Resp: 20  Temp: 97.8 F (36.6 C)  SpO2: 97%    Physical Exam   General: Appears well-developed and well-nourished. No acute distress.  HEENT Head: Normocephalic and atraumatic.   Ears: Hearing grossly intact, no drainage or visible deformity.  Nose: No nasal deviation.   Mouth/Throat: No stridor or tracheal deviation.  Non erythematous posterior pharynx noted with clear drainage present.  No white patchy exudate noted. Eyes: Conjunctivae and EOM are normal. No eye drainage or scleral icterus bilaterally.  Neck: Normal range of motion, neck is supple. No cervical, tonsillar or submandibular lymph nodes palpated. Cardiovascular: Normal rate. Regular rhythm; no murmurs, gallops, or rubs.  Pulm/Chest: No respiratory distress. Breath sounds normal bilaterally without wheezes, rhonchi, or rales.  Neurological: Alert and oriented to person, place, and time.  Skin:  Skin is warm and dry.  No rashes, lesions, abrasions or bruising noted to skin.   Psychiatric: Normal mood, affect, behavior, and thought content.   Vital signs and nursing note reviewed.   Patient stable and cooperative with examination. PROCEDURES:    LABS/X-RAYS/EKG/MEDS:   No results found for any visits on 11/16/20.  MEDICAL DECISION MAKING:   Patient presents with sore throat, left ear pain, loss of voice, postnasal drip onset yesterday.  She also endorses some chills last night.  Patient states that this morning she was having pain with speaking.  Patient reports the use of DayQuil and NyQuil which has not seemed to help.  She reports no known sick contacts. Patient does not report any shortness of breath, chest pain, palpitations, visual changes, weakness, tingling, headache, nausea, vomiting, diarrhea, chills.  Chart review completed.  Given symptoms along with assessment findings, likely viral illness.  COVID-19 PCR pending.  Rx'd viscous lidocaine to the patient's preferred pharmacy to help with sore throat.  At this time I do not suspect any underlying bacterial etiology which would require antibiotic use.  Advised of at home treatment and care as outlined in her AVS to include rest, Tylenol versus ibuprofen, Mucinex.  Return as needed.  Patient verbalized understanding and agreed with treatment plan.  Patient stable upon discharge. ASSESSMENT/PLAN:  1. Encounter for screening for COVID-19 - Novel Coronavirus, NAA ([facility]); Standing - Novel Coronavirus, NAA ([facility])  2. Viral illness  Instructions about new medications and side effects provided.  Plan:   Discharge Instructions      We will call you with any positive results from your COVID-19 testing completed in clinic  today.  If you do not receive a phone call from [place] within the next 2-3 days, check your MyChart for up-to-date health information related to testing completed in clinic today.   For most people this is  a self-limiting process and can take anywhere from 7 - 10 days to start feeling better. A cough can last up to 3 weeks. Pay special attention to handwashing as this can help prevent the spread of the virus.   Always read the labels of cough and cold medications as they may contain some of the ingredients below.  Rest, push lots of fluids (especially water), and utilize supportive care for symptoms. Use viscous lidocaine as directed and Zyrtec once daily You may take acetaminophen (Tylenol) every 4-6 hours and ibuprofen every 6-8 hours for muscle pain, joint pain, headaches (you may also alternate these medications). Mucinex (guaifenesin) may be taken over the counter for cough as needed can loosen phlegm. Please read the instructions and take as directed.  Sudafed (pseudophedrine) is sold behind the counter and can help reduce nasal pressure; avoid taking this if you have high blood pressure or feel jittery. Sudafed PE (phenylephrine) can be a helpful, short-term, over-the-counter alternative to limit side effects or if you have high blood pressure.  Flonase nasal spray can help alleviate congestion and sinus pressure. Many patients choose Afrin as a nasal decongestant; do not use for more than 3 days for risk of rebound (increased symptoms after stopping medication).  Saline nasal sprays or rinses can also help nasal congestion (use bottled or sterile water). Warm tea with lemon and honey can sooth sore throat and cough, as can cough drops.   Return to clinic for high fever not improving with medications, chest pain, difficulty breathing, non-stop vomiting, or coughing blood. Follow-up with your primary care provider if symptoms do not improve as expected in the next 5-7 days.          Debra Cain, [place] 11/16/20 [phone]

## 2020-11-17 LAB — NOVEL CORONAVIRUS, NAA: SARS-CoV-2, NAA: DETECTED — AB

## 2020-11-17 LAB — SARS-COV-2, NAA 2 DAY TAT

## 2021-01-19 ENCOUNTER — Telehealth: Payer: Self-pay | Admitting: Internal Medicine

## 2021-01-19 NOTE — Telephone Encounter (Signed)
States she is having severe abdominal pain off and on that causes her to double over.   Transferred to triage for further eval.   Caller states she is having abd pain on the right side of her abd. having BMs. Gas x and pepto bismol help the pain. Pain started yesterday afternoon. vomited yesterday. pain level 8-9.  Triage advised to go to ED now. Patient understood but was unsure of what she wanted to do.

## 2021-01-19 NOTE — Telephone Encounter (Signed)
Spoke with patient today.  Advised patient to try ED or Urgent care based on her pain.  She said she felt better after the pepto but was still having some pain.  Patient advised again to go to ED for further work up as they would be able to do labs and testing if needed.  She said if pain got to bad she would go.

## 2021-01-25 ENCOUNTER — Ambulatory Visit (INDEPENDENT_AMBULATORY_CARE_PROVIDER_SITE_OTHER): Payer: 59

## 2021-01-25 ENCOUNTER — Other Ambulatory Visit: Payer: Self-pay

## 2021-01-25 ENCOUNTER — Ambulatory Visit
Admission: RE | Admit: 2021-01-25 | Discharge: 2021-01-25 | Disposition: A | Discharge status: Home / Self Care | Payer: 59 | Source: Ambulatory Visit | Attending: Medical Oncology | Admitting: Medical Oncology

## 2021-01-25 VITALS — BP 139/99 | HR 70 | Temp 98.3°F | Resp 16 | Ht 65.0 in | Wt 110.0 lb

## 2021-01-25 DIAGNOSIS — R195 Other fecal abnormalities: Secondary | ICD-10-CM

## 2021-01-25 DIAGNOSIS — N200 Calculus of kidney: Secondary | ICD-10-CM | POA: Diagnosis not present

## 2021-01-25 DIAGNOSIS — R1031 Right lower quadrant pain: Secondary | ICD-10-CM

## 2021-01-25 DIAGNOSIS — R109 Unspecified abdominal pain: Secondary | ICD-10-CM | POA: Diagnosis not present

## 2021-01-25 DIAGNOSIS — K5900 Constipation, unspecified: Secondary | ICD-10-CM | POA: Diagnosis not present

## 2021-01-25 DIAGNOSIS — R350 Frequency of micturition: Secondary | ICD-10-CM

## 2021-01-25 LAB — URINALYSIS, COMPLETE (UACMP) WITH MICROSCOPIC
Bilirubin Urine: NEGATIVE
Glucose, UA: NEGATIVE mg/dL
Hgb urine dipstick: NEGATIVE
Ketones, ur: NEGATIVE mg/dL
Leukocytes,Ua: NEGATIVE
Nitrite: NEGATIVE
Protein, ur: NEGATIVE mg/dL
Specific Gravity, Urine: 1.025 (ref 1.005–1.030)
pH: 6.5 (ref 5.0–8.0)

## 2021-01-25 LAB — COMPREHENSIVE METABOLIC PANEL
ALT: 11 U/L (ref 0–44)
AST: 10 U/L — ABNORMAL LOW (ref 15–41)
Albumin: 4.4 g/dL (ref 3.5–5.0)
Alkaline Phosphatase: 71 U/L (ref 38–126)
Anion gap: 6 (ref 5–15)
BUN: 11 mg/dL (ref 6–20)
CO2: 27 mmol/L (ref 22–32)
Calcium: 8.9 mg/dL (ref 8.9–10.3)
Chloride: 101 mmol/L (ref 98–111)
Creatinine, Ser: 0.59 mg/dL (ref 0.44–1.00)
GFR, Estimated: >60 mL/min (ref >60)
Glucose, Bld: 111 mg/dL — ABNORMAL HIGH (ref 70–99)
Potassium: 3.5 mmol/L (ref 3.5–5.1)
Sodium: 134 mmol/L — ABNORMAL LOW (ref 135–145)
Total Bilirubin: 0.4 mg/dL (ref 0.3–1.2)
Total Protein: 7.7 g/dL (ref 6.5–8.1)

## 2021-01-25 LAB — CBC WITH DIFFERENTIAL/PLATELET
Abs Immature Granulocytes: 0.02 10*3/uL (ref 0.00–0.07)
Basophils Absolute: 0 10*3/uL (ref 0.0–0.1)
Basophils Relative: 0 %
Eosinophils Absolute: 0.1 10*3/uL (ref 0.0–0.5)
Eosinophils Relative: 1 %
HCT: 36.7 % (ref 36.0–46.0)
Hemoglobin: 12.7 g/dL (ref 12.0–15.0)
Immature Granulocytes: 0 %
Lymphocytes Relative: 34 %
Lymphs Abs: 2.1 10*3/uL (ref 0.7–4.0)
MCH: 29.5 pg (ref 26.0–34.0)
MCHC: 34.6 g/dL (ref 30.0–36.0)
MCV: 85.2 fL (ref 80.0–100.0)
Monocytes Absolute: 0.4 10*3/uL (ref 0.1–1.0)
Monocytes Relative: 7 %
Neutro Abs: 3.5 10*3/uL (ref 1.7–7.7)
Neutrophils Relative %: 58 %
Platelets: 366 10*3/uL (ref 150–400)
RBC: 4.31 MIL/uL (ref 3.87–5.11)
RDW: 11.7 % (ref 11.5–15.5)
WBC: 6.1 10*3/uL (ref 4.0–10.5)
nRBC: 0 % (ref 0.0–0.2)

## 2021-01-25 NOTE — ED Provider Notes (Signed)
[facility]    CSN: [id number] Arrival date & time: 01/25/21  1621      History   Chief Complaint No chief complaint on file.   HPI Debra Cain is a 50 y.o. female presenting for multiple complaints.  First, patient states that over the past 5 days she has had right lower quadrant abdominal pain.  Patient reports having an episode of severe pain that had her "doubled over" 5 days ago.  Reports that she took Pepto-Bismol and about 30 minutes later the pain subsided.  She thought it was related to gas.  She says she had another episode of pain like this and took some more Pepto-Bismol.  Patient reports in the past couple of days she is not taking any more Pepto-Bismol.  She does report 2 bowel movements in the last 3 days and says they have been dark in coloration.  She describes them as "dark gray."  Stools are fully formed.  Patient says it is normal for her to have bowel movements every couple of days like this.  She says the abdominal pain is not like it was at onset.  Patient concerned because her stools are dark.  Normal appetite, no fever or nausea/vomiting.  Denies any bright red blood in stool.  No history of hemorrhoids per patient.  Patient also says that she has had increased urinary frequency over the past 1 to 2 days.  Denies dysuria or urgency.  Patient has a history of chronic interstitial cystitis and believes she may be developing a UTI.  No flank pain or hematuria.  No other complaints.  HPI  Past Medical History:  Diagnosis Date   Anxiety    Chronic cough    neuropathic, following with ENT at [facility] for selective vagal nerve blocks (fall 2016)   Chronic interstitial cystitis    Complication of anesthesia    slow to wake up   Detached retina 06/2014   left eye   GERD (gastroesophageal reflux disease)    takes pepcid    Patient Active Problem List   Diagnosis Date Noted   Vertigo 04/28/2018   Hypoglycemia 04/28/2018   Chronic cough 07/16/2016   Upper  airway cough syndrome 05/17/2015   Detached retina 12/09/2014   Cough variant asthma 03/01/2014   Anxiety 09/24/2011   Choking 09/24/2011   Chronic interstitial cystitis     Past Surgical History:  Procedure Laterality Date   APPENDECTOMY  07/1996   EYE SURGERY Bilateral    Lasik    GAS/FLUID EXCHANGE Left 06/17/2014   Procedure: GAS/FLUID EXCHANGE  C3F8;  Surgeon: Sherlynn Stalls, MD;  Location: [facility];  Service: Ophthalmology;  Laterality: Left;   Urology surgery  06/23/01   VITRECTOMY 25 GAUGE WITH SCLERAL BUCKLE Left 06/17/2014   Procedure: VITRECTOMY 25 GAUGE WITH SCLERAL BUCKLE;  Surgeon: Sherlynn Stalls, MD;  Location: [facility];  Service: Ophthalmology;  Laterality: Left;    OB History   No obstetric history on file.      Home Medications    Prior to Admission medications   Medication Sig Start Date End Date Taking? Authorizing Provider  ALPRAZolam (XANAX) 0.25 MG tablet TAKE 1 TABLET(0.25 MG) BY MOUTH TWICE DAILY AS NEEDED FOR ANXIETY 11/04/20  Yes Burns, Claudina Lick, MD  iron polysaccharides (NIFEREX) 150 MG capsule Take 150 mg by mouth daily. 08/23/20  Yes [provider]  sertraline (ZOLOFT) 100 MG tablet TAKE 1 TABLET(100 MG) BY MOUTH DAILY 11/04/20  Yes Burns, Claudina Lick, MD  lidocaine (XYLOCAINE) 2 % solution Use as directed 15 mLs in the mouth or throat as needed for mouth pain. 11/16/20   BodduErasmo Downer, FNP    Family History Family History  Problem Relation Age of Onset   Colitis Father     Social History Social History   Tobacco Use   Smoking status: Never   Smokeless tobacco: Never  Substance Use Topics   Alcohol use: No   Drug use: No     Allergies   Augmentin [amoxicillin-pot clavulanate], Ciprofloxacin, Guaifenesin, Levaquin [levofloxacin], Macrobid [nitrofurantoin macrocrystal], Minocycline, Pseudoephedrine, and Effexor [venlafaxine]   Review of Systems Review of Systems  Constitutional:  Negative for chills, diaphoresis, fatigue and fever.   Respiratory:  Negative for shortness of breath.   Cardiovascular:  Negative for chest pain.  Gastrointestinal:  Positive for abdominal pain. Negative for abdominal distention, constipation, diarrhea, nausea and vomiting.  Genitourinary:  Positive for frequency. Negative for difficulty urinating, dysuria and urgency.  Musculoskeletal:  Negative for arthralgias and myalgias.  Skin:  Negative for rash.  Neurological:  Negative for weakness and headaches.    Physical Exam Triage Vital Signs ED Triage Vitals  Enc Vitals Group     BP 01/25/21 1649 (!) 139/99     Pulse Rate 01/25/21 1649 70     Resp 01/25/21 1649 16     Temp 01/25/21 1656 98.3 F (36.8 C)     Temp Source 01/25/21 1649 Oral     SpO2 01/25/21 1649 99 %     Weight 01/25/21 1647 110 lb (49.9 kg)     Height 01/25/21 1647 5\' 5"  (1.651 m)     Head Circumference --      Peak Flow --      Pain Score --      Pain Loc --      Pain Edu? --      Excl. in [place]? --    No data found.  Updated Vital Signs BP (!) 139/99 (BP Location: Left Arm)   Pulse 70   Temp 98.3 F (36.8 C) (Oral)   Resp 16   Ht 5\' 5"  (1.651 m)   Wt 110 lb (49.9 kg)   LMP 01/18/2021 (Approximate)   SpO2 99%   BMI 18.30 kg/m       Physical Exam Vitals and nursing note reviewed.  Constitutional:      General: She is not in acute distress.    Appearance: Normal appearance. She is not ill-appearing or toxic-appearing.  HENT:     Head: Normocephalic and atraumatic.     Nose: Nose normal.     Mouth/Throat:     Mouth: Mucous membranes are moist.     Pharynx: Oropharynx is clear.  Eyes:     General: No scleral icterus.       Right eye: No discharge.        Left eye: No discharge.     Conjunctiva/sclera: Conjunctivae normal.  Cardiovascular:     Rate and Rhythm: Normal rate and regular rhythm.     Heart sounds: Normal heart sounds.  Pulmonary:     Effort: Pulmonary effort is normal. No respiratory distress.     Breath sounds: Normal breath sounds.   Abdominal:     Palpations: Abdomen is soft.     Tenderness: There is no abdominal tenderness. There is no right CVA tenderness or left CVA tenderness.  Musculoskeletal:     Cervical back: Neck supple.  Skin:    General: Skin is dry.  Neurological:     General: No focal deficit present.     Mental Status: She is alert. Mental status is at baseline.     Motor: No weakness.     Gait: Gait normal.  Psychiatric:        Mood and Affect: Mood normal.        Behavior: Behavior normal.        Thought Content: Thought content normal.     UC Treatments / Results  Labs (all labs ordered are listed, but only abnormal results are displayed) Labs Reviewed  URINALYSIS, COMPLETE (UACMP) WITH MICROSCOPIC - Abnormal; Notable for the following components:      Result Value   Bacteria, UA FEW (*)    All other components within normal limits  COMPREHENSIVE METABOLIC PANEL - Abnormal; Notable for the following components:   Sodium 134 (*)    Glucose, Bld 111 (*)    AST 10 (*)    All other components within normal limits  URINE CULTURE  CBC WITH DIFFERENTIAL/PLATELET    EKG   Radiology DG Abdomen 1 View  Result Date: 01/25/2021 CLINICAL DATA:  Right flank pain EXAM: ABDOMEN - 1 VIEW COMPARISON:  None. FINDINGS: Small bilateral renal calculi, projecting over the lower pole of the right kidney and midpole of the left kidney. No visible calcifications over the expected course of the ureters. Large stool burden throughout the colon. No organomegaly, free air, bowel obstruction. No acute bony abnormality. Visualized lung bases clear. IMPRESSION: Bilateral nephrolithiasis. Electronically Signed   By: Rolm Baptise M.D.   On: 01/25/2021 17:46    Procedures Procedures (including critical care time)  Medications Ordered in UC Medications - No data to display  Initial Impression / Assessment and Plan / UC Course  I have reviewed the triage vital signs and the nursing notes.  Pertinent labs &  imaging results that were available during my care of the patient were reviewed by me and considered in my medical decision making (see chart for details).  50 year old female presenting for right lower quadrant abdominal pain x5 days.  This has been associated with episodes of cramping that almost feeling menstrual cramps.  Patient states her last menstrual period was a week ago.  Not currently having any abdominal pain.  It is associated with dark stools but she has taken Pepto-Bismol.  Patient also complains of urinary frequency without pain.  History of interstitial cystitis.  Vitals are stable today.  She is afebrile.  She is overall well-appearing.  Exam significant for no tenderness to palpation of abdomen and no CVA tenderness.  Chest clear to auscultation heart regular rate and rhythm.  Urinalysis today shows no signs consistent with UTI but will send urine for culture.  Advised patient increase fluid at this time and if urine culture does grow bacteria we will call and send antibiotics.  CBC, CMP and KUB obtained today.  Patient did not want to stay and wait for results.  She has a class to practice clogging this evening.  Advised her that I will contact her with results.  CBC is normal.  CMP is reassuring.  Slightly decreased sodium at 134 and slightly increased glucose at 111 but patient is nonfasting.  KUB does show bilateral nephrolithiasis.  Patient not having any flank pain and likely unrelated to her symptoms.  It also does show a large stool burden.  I did discuss with patient performing a rectal exam to assess for hemorrhoids and also obtaining a Hemoccult to  check to see if she did have blood in stool if I was able to get a stool sample.  Patient not keen on this idea.  I discussed with her that her stools are likely dark because of the Pepto-Bismol that she took.  We will hold off on a rectal exam at this point.  Advised her not to take any more Pepto-Bismol which she has not  had the past few days.  Advised her to increase fluid intake and if abdominal pain continues or worsens or if she continues to have dark stools or any rectal pain she should follow with PCP or GI.  May consider taking a stool softener at this point to help with constipation.  Patient is due for colonoscopy and advised her to reach out to her OB/GYN or PCP to have this scheduled.  Advised going to emergency department for any severe acute worsening of abdominal pain.  Final Clinical Impressions(s) / UC Diagnoses   Final diagnoses:  Right lower quadrant abdominal pain  Dark stools  Urinary frequency  Constipation, unspecified constipation type  Nephrolithiasis     Discharge Instructions      -The urine does not show any evidence of UTI.  I will send the urine for culture and we will call you with the results if you need to start treatment for UTI but does not appear to be UTI.  Increase fluid intake. -CBC is normal.  No evidence of anemia. -Metabolic panel is pending.  Results will be available on your MyChart and I will call if there are any abnormal results. -Still waiting on results of your x-ray.  We will call with results when I get them.  -All the labs look good.  X-ray does show nephrolithiasis or kidney stones.  Unlikely the cause of your pain.  However, if you develop flank pain at any points or hematuria you may want to be seen for this.  The stones can be present for a while and not cause any discomfort.  Whenever they move and try to pass that is when they begin to become painful. -Large amount of stool present.  Consider taking MiraLAX or stool softener.  Avoid Pepto-Bismol. -Make follow-up appoint with your PCP. -Go to emergency department for any worsening of abdominal pain.     ED Prescriptions   None    PDMP not reviewed this encounter.   Danton Clap, PA-C 01/25/21 1931

## 2021-01-25 NOTE — Discharge Instructions (Addendum)
-  The urine does not show any evidence of UTI.  I will send the urine for culture and we will call you with the results if you need to start treatment for UTI but does not appear to be UTI.  Increase fluid intake. -CBC is normal.  No evidence of anemia. -Metabolic panel is pending.  Results will be available on your MyChart and I will call if there are any abnormal results. -Still waiting on results of your x-ray.  We will call with results when I get them.  -All the labs look good.  X-ray does show nephrolithiasis or kidney stones.  Unlikely the cause of your pain.  However, if you develop flank pain at any points or hematuria you may want to be seen for this.  The stones can be present for a while and not cause any discomfort.  Whenever they move and try to pass that is when they begin to become painful. -Large amount of stool present.  Consider taking MiraLAX or stool softener.  Avoid Pepto-Bismol. -Make follow-up appoint with your PCP. -Go to emergency department for any worsening of abdominal pain.

## 2021-01-25 NOTE — ED Triage Notes (Signed)
Pt c/o frequent urination, right side pain x 3 days, black stools x 2 days.

## 2021-01-27 LAB — URINE CULTURE: Culture: <10000 — AB

## 2021-03-21 ENCOUNTER — Other Ambulatory Visit: Payer: Self-pay | Admitting: Gastroenterology

## 2021-03-21 ENCOUNTER — Ambulatory Visit
Admission: RE | Admit: 2021-03-21 | Discharge: 2021-03-21 | Disposition: A | Discharge status: Home / Self Care | Payer: 59 | Source: Ambulatory Visit | Attending: Gastroenterology | Admitting: Gastroenterology

## 2021-03-21 DIAGNOSIS — R1084 Generalized abdominal pain: Secondary | ICD-10-CM

## 2021-03-21 DIAGNOSIS — G8918 Other acute postprocedural pain: Secondary | ICD-10-CM

## 2021-05-23 ENCOUNTER — Other Ambulatory Visit: Payer: Self-pay | Admitting: Internal Medicine

## 2021-08-18 DIAGNOSIS — R87611 Atypical squamous cells cannot exclude high grade squamous intraepithelial lesion on cytologic smear of cervix (ASC-H): Secondary | ICD-10-CM | POA: Diagnosis not present

## 2021-08-18 DIAGNOSIS — R87619 Unspecified abnormal cytological findings in specimens from cervix uteri: Secondary | ICD-10-CM | POA: Diagnosis not present

## 2021-08-18 DIAGNOSIS — R69 Illness, unspecified: Secondary | ICD-10-CM | POA: Diagnosis not present

## 2021-12-28 NOTE — Progress Notes (Signed)
Subjective:    Patient ID: Debra Cain, female    DOB: June 06, 1970, 51 y.o.   MRN: [id number]      HPI Debra Cain is here for a Physical exam.   Overall doing well.  She has no concerns.   Medications and allergies reviewed with patient and updated if appropriate.  Current Outpatient Medications on File Prior to Visit  Medication Sig Dispense Refill   ALPRAZolam (XANAX) 0.25 MG tablet TAKE 1 TABLET(0.25 MG) BY MOUTH TWICE DAILY AS NEEDED FOR ANXIETY 30 tablet 0   No current facility-administered medications on file prior to visit.    Review of Systems  Constitutional:  Negative for fever.  Eyes:  Negative for visual disturbance.  Respiratory:  Positive for cough. Negative for shortness of breath and wheezing.   Cardiovascular:  Negative for chest pain, palpitations and leg swelling.  Gastrointestinal:  Negative for abdominal pain, blood in stool, constipation, diarrhea and nausea.  Genitourinary:  Negative for dysuria.  Musculoskeletal:  Negative for arthralgias and back pain.  Skin:  Negative for rash.  Neurological:  Negative for light-headedness and headaches.  Psychiatric/Behavioral:  Negative for dysphoric mood. The patient is nervous/anxious.        Objective:   Vitals:   12/29/21 1346  BP: 118/72  Pulse: 80  Temp: 97.8 F (36.6 C)  SpO2: 99%   Filed Weights   12/29/21 1346  Weight: 119 lb (54 kg)   Body mass index is 19.8 kg/m.  BP Readings from Last 3 Encounters:  12/29/21 118/72  01/25/21 (!) 139/99  11/16/20 131/86    Wt Readings from Last 3 Encounters:  12/29/21 119 lb (54 kg)  01/25/21 110 lb (49.9 kg)  11/04/20 112 lb 3.2 oz (50.9 kg)       Physical Exam Constitutional: She appears well-developed and well-nourished. No distress.  HENT:  Head: Normocephalic and atraumatic.  Right Ear: External ear normal. Normal ear canal and TM Left Ear: External ear normal.  Normal ear canal and TM Mouth/Throat: Oropharynx is clear and moist.   Eyes: Conjunctivae normal.  Neck: Neck supple. No tracheal deviation present. No thyromegaly present.  No carotid bruit  Cardiovascular: Normal rate, regular rhythm and normal heart sounds.   No murmur heard.  No edema. Pulmonary/Chest: Effort normal and breath sounds normal. No respiratory distress. She has no wheezes. She has no rales.  Breast: deferred   Abdominal: Soft. She exhibits no distension. There is no tenderness.  Lymphadenopathy: She has no cervical adenopathy.  Skin: Skin is warm and dry. She is not diaphoretic.  Psychiatric: She has a normal mood and affect. Her behavior is normal.     Lab Results  Component Value Date   WBC 6.1 01/25/2021   HGB 12.7 01/25/2021   HCT 36.7 01/25/2021   PLT 366 01/25/2021   GLUCOSE 111 (H) 01/25/2021   CHOL 166 10/19/2013   TRIG 39.0 10/19/2013   HDL 47.90 10/19/2013   LDLCALC 110 (H) 10/19/2013   ALT 11 01/25/2021   AST 10 (L) 01/25/2021   NA 134 (L) 01/25/2021   K 3.5 01/25/2021   CL 101 01/25/2021   CREATININE 0.59 01/25/2021   BUN 11 01/25/2021   CO2 27 01/25/2021   TSH 1.30 04/28/2018         Assessment & Plan:   Physical exam: Screening blood work  ordered Exercise  some Weight  normal Substance abuse  none   Reviewed recommended immunizations.  Had colonoscopy-we will get report  Health Maintenance  Topic Date Due   COLONOSCOPY (Pts 45-14yr Insurance coverage will need to be confirmed)  Never done   COVID-19 Vaccine (1) 01/14/2022 (Originally 09/27/1971)   TETANUS/TDAP  12/30/2022 (Originally 07/31/2014)   PAP SMEAR-Modifier  01/21/2023   HPV VACCINES  Aged Out   INFLUENZA VACCINE  Discontinued   Hepatitis C Screening  Discontinued   HIV Screening  Discontinued          See Problem List for Assessment and Plan of chronic medical problems.

## 2021-12-28 NOTE — Patient Instructions (Addendum)
Medications changes include :   none   Your prescription(s) have been sent to your pharmacy.    Return in about 1 year (around 12/30/2022) for Physical Exam.   Health Maintenance, Female Adopting a healthy lifestyle and getting preventive care are important in promoting health and wellness. Ask your health care provider about: The right schedule for you to have regular tests and exams. Things you can do on your own to prevent diseases and keep yourself healthy. What should I know about diet, weight, and exercise? Eat a healthy diet  Eat a diet that includes plenty of vegetables, fruits, low-fat dairy products, and lean protein. Do not eat a lot of foods that are high in solid fats, added sugars, or sodium. Maintain a healthy weight Body mass index (BMI) is used to identify weight problems. It estimates body fat based on height and weight. Your health care provider can help determine your BMI and help you achieve or maintain a healthy weight. Get regular exercise Get regular exercise. This is one of the most important things you can do for your health. Most adults should: Exercise for at least 150 minutes each week. The exercise should increase your heart rate and make you sweat (moderate-intensity exercise). Do strengthening exercises at least twice a week. This is in addition to the moderate-intensity exercise. Spend less time sitting. Even light physical activity can be beneficial. Watch cholesterol and blood lipids Have your blood tested for lipids and cholesterol at 50 years of age, then have this test every 5 years. Have your cholesterol levels checked more often if: Your lipid or cholesterol levels are high. You are older than 50 years of age. You are at high risk for heart disease. What should I know about cancer screening? Depending on your health history and family history, you may need to have cancer screening at various ages. This may include screening for: Breast  cancer. Cervical cancer. Colorectal cancer. Skin cancer. Lung cancer. What should I know about heart disease, diabetes, and high blood pressure? Blood pressure and heart disease High blood pressure causes heart disease and increases the risk of stroke. This is more likely to develop in people who have high blood pressure readings or are overweight. Have your blood pressure checked: Every 3-5 years if you are 63-65 years of age. Every year if you are 69 years old or older. Diabetes Have regular diabetes screenings. This checks your fasting blood sugar level. Have the screening done: Once every three years after age 23 if you are at a normal weight and have a low risk for diabetes. More often and at a younger age if you are overweight or have a high risk for diabetes. What should I know about preventing infection? Hepatitis B If you have a higher risk for hepatitis B, you should be screened for this virus. Talk with your health care provider to find out if you are at risk for hepatitis B infection. Hepatitis C Testing is recommended for: Everyone born from [birth year] through 1964. Anyone with known risk factors for hepatitis C. Sexually transmitted infections (STIs) Get screened for STIs, including gonorrhea and chlamydia, if: You are sexually active and are younger than 50 years of age. You are older than 50 years of age and your health care provider tells you that you are at risk for this type of infection. Your sexual activity has changed since you were last screened, and you are at increased risk for chlamydia or gonorrhea. Ask your health  care provider if you are at risk. Ask your health care provider about whether you are at high risk for HIV. Your health care provider may recommend a prescription medicine to help prevent HIV infection. If you choose to take medicine to prevent HIV, you should first get tested for HIV. You should then be tested every 3 months for as long as you are taking  the medicine. Pregnancy If you are about to stop having your period (premenopausal) and you may become pregnant, seek counseling before you get pregnant. Take 400 to 800 micrograms (mcg) of folic acid every day if you become pregnant. Ask for birth control (contraception) if you want to prevent pregnancy. Osteoporosis and menopause Osteoporosis is a disease in which the bones lose minerals and strength with aging. This can result in bone fractures. If you are 55 years old or older, or if you are at risk for osteoporosis and fractures, ask your health care provider if you should: Be screened for bone loss. Take a calcium or vitamin D supplement to lower your risk of fractures. Be given hormone replacement therapy (HRT) to treat symptoms of menopause. Follow these instructions at home: Alcohol use Do not drink alcohol if: Your health care provider tells you not to drink. You are pregnant, may be pregnant, or are planning to become pregnant. If you drink alcohol: Limit how much you have to: 0-1 drink a day. Know how much alcohol is in your drink. In the [place] one drink equals one 12 oz bottle of beer (355 mL), one 5 oz glass of wine (148 mL), or one 1 oz glass of hard liquor (44 mL). Lifestyle Do not use any products that contain nicotine or tobacco. These products include cigarettes, chewing tobacco, and vaping devices, such as e-cigarettes. If you need help quitting, ask your health care provider. Do not use street drugs. Do not share needles. Ask your health care provider for help if you need support or information about quitting drugs. General instructions Schedule regular health, dental, and eye exams. Stay current with your vaccines. Tell your health care provider if: You often feel depressed. You have ever been abused or do not feel safe at home. Summary Adopting a healthy lifestyle and getting preventive care are important in promoting health and wellness. Follow your health  care provider's instructions about healthy diet, exercising, and getting tested or screened for diseases. Follow your health care provider's instructions on monitoring your cholesterol and blood pressure. This information is not intended to replace advice given to you by your health care provider. Make sure you discuss any questions you have with your health care provider. Document Revised: 08/15/2020 Document Reviewed: 08/15/2020 Elsevier Patient Education  [employer].

## 2021-12-29 ENCOUNTER — Encounter: Payer: Self-pay | Admitting: Internal Medicine

## 2021-12-29 ENCOUNTER — Ambulatory Visit (INDEPENDENT_AMBULATORY_CARE_PROVIDER_SITE_OTHER): Payer: 59 | Admitting: Internal Medicine

## 2021-12-29 VITALS — BP 118/72 | HR 80 | Temp 97.8°F | Ht 65.0 in | Wt 119.0 lb

## 2021-12-29 DIAGNOSIS — R69 Illness, unspecified: Secondary | ICD-10-CM | POA: Diagnosis not present

## 2021-12-29 DIAGNOSIS — R053 Chronic cough: Secondary | ICD-10-CM | POA: Diagnosis not present

## 2021-12-29 DIAGNOSIS — F419 Anxiety disorder, unspecified: Secondary | ICD-10-CM

## 2021-12-29 DIAGNOSIS — Z0000 Encounter for general adult medical examination without abnormal findings: Secondary | ICD-10-CM | POA: Diagnosis not present

## 2021-12-29 MED ORDER — ALPRAZOLAM 0.25 MG PO TABS: ORAL_TABLET | ORAL | 5 refills | Status: DC

## 2021-12-29 MED ORDER — SERTRALINE HCL 100 MG PO TABS: ORAL_TABLET | ORAL | 3 refills | Status: DC

## 2021-12-29 NOTE — Assessment & Plan Note (Signed)
Chronic Refractory Likely neurogenic in nature Has seen several specialist and been on several medication

## 2021-12-29 NOTE — Assessment & Plan Note (Signed)
Chronic Controlled, Stable Continue sertraline 100 mg daily, alprazolam 0.25 mg twice daily as needed for anxiety

## 2022-01-17 ENCOUNTER — Ambulatory Visit
Admission: EM | Admit: 2022-01-17 | Discharge: 2022-01-17 | Disposition: A | Discharge status: Home / Self Care | Payer: 59 | Attending: Emergency Medicine | Admitting: Emergency Medicine

## 2022-01-17 DIAGNOSIS — R319 Hematuria, unspecified: Secondary | ICD-10-CM

## 2022-01-17 DIAGNOSIS — N3011 Interstitial cystitis (chronic) with hematuria: Secondary | ICD-10-CM | POA: Diagnosis not present

## 2022-01-17 LAB — POCT URINALYSIS DIP (MANUAL ENTRY)
Bilirubin, UA: NEGATIVE
Glucose, UA: NEGATIVE mg/dL
Leukocytes, UA: NEGATIVE
Nitrite, UA: NEGATIVE
Protein Ur, POC: NEGATIVE mg/dL
Spec Grav, UA: 1.025 (ref 1.010–1.025)
Urobilinogen, UA: 0.2 E.U./dL
pH, UA: 5.5 (ref 5.0–8.0)

## 2022-01-17 LAB — POCT URINE PREGNANCY: Preg Test, Ur: NEGATIVE

## 2022-01-17 NOTE — ED Triage Notes (Addendum)
Patient to Urgent Care with complaints of bladder/ vaginal pressure. Reports discomfort woke up her up. Left sided flank pain- reports she has known kidney stones. Symptoms started this morning. Some dysuria.   Interstitial cystitis has been in remission, at times has a flare up but states this feels different. Reports taking cephalexin '500mg'$  in the past (multiple abx allergies) and this has worked well.

## 2022-01-17 NOTE — ED Provider Notes (Signed)
Roderic Palau    CSN: [id number] Arrival date & time: 01/17/22  1628      History   Chief Complaint Chief Complaint  Patient presents with   Urinary Frequency    HPI Debra Cain is a 51 y.o. female.  Patient presents with bladder pressure and left flank pain since this morning.  No fever, chills, abdominal pain, hematuria, nausea, vomiting, diarrhea, vaginal discharge, or other symptoms.  No treatment at home.  She has history of kidney stones and chronic interstitial cystitis.  The history is provided by the patient and medical records.    Past Medical History:  Diagnosis Date   Anxiety    Chronic cough    neuropathic, following with ENT at [facility] for selective vagal nerve blocks (fall 2016)   Chronic interstitial cystitis    Complication of anesthesia    slow to wake up   Detached retina 06/2014   left eye   GERD (gastroesophageal reflux disease)    takes pepcid    Patient Active Problem List   Diagnosis Date Noted   Vertigo 04/28/2018   Hypoglycemia 04/28/2018   Chronic cough 07/16/2016   Upper airway cough syndrome 05/17/2015   Detached retina 12/09/2014   Cough variant asthma 03/01/2014   Anxiety 09/24/2011   Choking 09/24/2011   Chronic interstitial cystitis     Past Surgical History:  Procedure Laterality Date   APPENDECTOMY  07/1996   EYE SURGERY Bilateral    Lasik    GAS/FLUID EXCHANGE Left 06/17/2014   Procedure: GAS/FLUID EXCHANGE  C3F8;  Surgeon: Sherlynn Stalls, MD;  Location: [place];  Service: Ophthalmology;  Laterality: Left;   Urology surgery  06/23/01   VITRECTOMY 25 GAUGE WITH SCLERAL BUCKLE Left 06/17/2014   Procedure: VITRECTOMY 25 GAUGE WITH SCLERAL BUCKLE;  Surgeon: Sherlynn Stalls, MD;  Location: [place];  Service: Ophthalmology;  Laterality: Left;    OB History   No obstetric history on file.      Home Medications    Prior to Admission medications   Medication Sig Start Date End Date Taking? Authorizing Provider  ALPRAZolam  (XANAX) 0.25 MG tablet TAKE 1 TABLET(0.25 MG) BY MOUTH TWICE DAILY AS NEEDED FOR ANXIETY 12/29/21   Binnie Rail, MD  sertraline (ZOLOFT) 100 MG tablet TAKE 1 TABLET(100 MG) BY MOUTH DAILY 12/29/21   Binnie Rail, MD    Family History Family History  Problem Relation Age of Onset   Colitis Father     Social History Social History   Tobacco Use   Smoking status: Never   Smokeless tobacco: Never  Substance Use Topics   Alcohol use: No   Drug use: No     Allergies   Augmentin [amoxicillin-pot clavulanate], Ciprofloxacin, Guaifenesin, Levaquin [levofloxacin], Macrobid [nitrofurantoin macrocrystal], Minocycline, Pseudoephedrine, and Effexor [venlafaxine]   Review of Systems Review of Systems  Constitutional:  Negative for chills and fever.  Gastrointestinal:  Negative for abdominal pain, diarrhea, nausea and vomiting.  Genitourinary:  Positive for dysuria and flank pain. Negative for hematuria, pelvic pain and vaginal discharge.  Skin:  Negative for color change and rash.  All other systems reviewed and are negative.    Physical Exam Triage Vital Signs ED Triage Vitals  Enc Vitals Group     BP 01/17/22 1644 123/84     Pulse Rate 01/17/22 1644 94     Resp 01/17/22 1644 18     Temp 01/17/22 1644 98.1 F (36.7 C)     Temp src --  SpO2 01/17/22 1644 97 %     Weight 01/17/22 1648 119 lb (54 kg)     Height 01/17/22 1648 '5\' 5"'$  (1.651 m)     Head Circumference --      Peak Flow --      Pain Score 01/17/22 1648 4     Pain Loc --      Pain Edu? --      Excl. in [place]? --    No data found.  Updated Vital Signs BP 123/84   Pulse 94   Temp 98.1 F (36.7 C)   Resp 18   Ht '5\' 5"'$  (1.651 m)   Wt 119 lb (54 kg)   LMP 01/07/2022   SpO2 97%   BMI 19.80 kg/m   Visual Acuity Right Eye Distance:   Left Eye Distance:   Bilateral Distance:    Right Eye Near:   Left Eye Near:    Bilateral Near:     Physical Exam Vitals and nursing note reviewed.  Constitutional:       General: She is not in acute distress.    Appearance: Normal appearance. She is well-developed. She is not ill-appearing.  HENT:     Mouth/Throat:     Mouth: Mucous membranes are moist.  Cardiovascular:     Rate and Rhythm: Normal rate and regular rhythm.     Heart sounds: Normal heart sounds.  Pulmonary:     Effort: Pulmonary effort is normal. No respiratory distress.     Breath sounds: Normal breath sounds.  Abdominal:     General: Bowel sounds are normal.     Palpations: Abdomen is soft.     Tenderness: There is no abdominal tenderness. There is left CVA tenderness. There is no right CVA tenderness, guarding or rebound.  Musculoskeletal:     Cervical back: Neck supple.  Skin:    General: Skin is warm and dry.  Neurological:     Mental Status: She is alert.  Psychiatric:        Mood and Affect: Mood normal.        Behavior: Behavior normal.      UC Treatments / Results  Labs (all labs ordered are listed, but only abnormal results are displayed) Labs Reviewed  POCT URINALYSIS DIP (MANUAL ENTRY) - Abnormal; Notable for the following components:      Result Value   Ketones, POC UA trace (5) (*)    Blood, UA large (*)    All other components within normal limits  POCT URINE PREGNANCY    EKG   Radiology No results found.  Procedures Procedures (including critical care time)  Medications Ordered in UC Medications - No data to display  Initial Impression / Assessment and Plan / UC Course  I have reviewed the triage vital signs and the nursing notes.  Pertinent labs & imaging results that were available during my care of the patient were reviewed by me and considered in my medical decision making (see chart for details).   Hematuria, interstitial cystitis.  Afebrile and vital signs are stable.  Patient has known history of kidney stones and chronic interstitial cystitis.  Instructed patient to follow-up with her urologist tomorrow morning.  ED precautions  discussed.  Education provided on hematuria and interstitial cystitis.  Discussed symptomatic treatment including Tylenol or ibuprofen.  Patient agrees to plan of care.   Final Clinical Impressions(s) / UC Diagnoses   Final diagnoses:  Hematuria, unspecified type  Interstitial cystitis (chronic) with hematuria  Discharge Instructions      Your urine does not show signs of infection today.    Go to the emergency department if you have uncontrolled pain or worsening symptoms.  Follow up with your PCP or urologist tomorrow.       ED Prescriptions   None    PDMP not reviewed this encounter.   Sharion Balloon, NP 01/17/22 1724

## 2022-01-17 NOTE — Discharge Instructions (Addendum)
Your urine does not show signs of infection today.    Go to the emergency department if you have uncontrolled pain or worsening symptoms.  Follow up with your PCP or urologist tomorrow.

## 2022-01-18 ENCOUNTER — Emergency Department (HOSPITAL_COMMUNITY): Payer: 59

## 2022-01-18 ENCOUNTER — Other Ambulatory Visit: Payer: Self-pay

## 2022-01-18 ENCOUNTER — Encounter (HOSPITAL_COMMUNITY): Payer: Self-pay

## 2022-01-18 ENCOUNTER — Emergency Department (HOSPITAL_COMMUNITY)
Admission: EM | Admit: 2022-01-18 | Discharge: 2022-01-18 | Disposition: A | Discharge status: Home / Self Care | Payer: 59 | Attending: Emergency Medicine | Admitting: Emergency Medicine

## 2022-01-18 DIAGNOSIS — N202 Calculus of kidney with calculus of ureter: Secondary | ICD-10-CM | POA: Diagnosis not present

## 2022-01-18 DIAGNOSIS — N201 Calculus of ureter: Secondary | ICD-10-CM | POA: Insufficient documentation

## 2022-01-18 DIAGNOSIS — R109 Unspecified abdominal pain: Secondary | ICD-10-CM | POA: Diagnosis not present

## 2022-01-18 DIAGNOSIS — I700 Atherosclerosis of aorta: Secondary | ICD-10-CM | POA: Diagnosis not present

## 2022-01-18 DIAGNOSIS — R7309 Other abnormal glucose: Secondary | ICD-10-CM | POA: Diagnosis not present

## 2022-01-18 DIAGNOSIS — K7689 Other specified diseases of liver: Secondary | ICD-10-CM | POA: Diagnosis not present

## 2022-01-18 HISTORY — DX: Disorder of kidney and ureter, unspecified: N28.9

## 2022-01-18 LAB — CBC WITH DIFFERENTIAL/PLATELET
Abs Immature Granulocytes: 0.04 10*3/uL (ref 0.00–0.07)
Basophils Absolute: 0 10*3/uL (ref 0.0–0.1)
Basophils Relative: 0 %
Eosinophils Absolute: 0.1 10*3/uL (ref 0.0–0.5)
Eosinophils Relative: 1 %
HCT: 39.2 % (ref 36.0–46.0)
Hemoglobin: 13.5 g/dL (ref 12.0–15.0)
Immature Granulocytes: 0 %
Lymphocytes Relative: 32 %
Lymphs Abs: 2.9 10*3/uL (ref 0.7–4.0)
MCH: 29.1 pg (ref 26.0–34.0)
MCHC: 34.4 g/dL (ref 30.0–36.0)
MCV: 84.5 fL (ref 80.0–100.0)
Monocytes Absolute: 0.4 10*3/uL (ref 0.1–1.0)
Monocytes Relative: 5 %
Neutro Abs: 5.6 10*3/uL (ref 1.7–7.7)
Neutrophils Relative %: 62 %
Platelets: 365 10*3/uL (ref 150–400)
RBC: 4.64 MIL/uL (ref 3.87–5.11)
RDW: 11.7 % (ref 11.5–15.5)
WBC: 9 10*3/uL (ref 4.0–10.5)
nRBC: 0 % (ref 0.0–0.2)

## 2022-01-18 LAB — URINALYSIS, ROUTINE W REFLEX MICROSCOPIC
Bilirubin Urine: NEGATIVE
Glucose, UA: NEGATIVE mg/dL
Ketones, ur: 5 mg/dL — AB
Leukocytes,Ua: NEGATIVE
Nitrite: NEGATIVE
Protein, ur: 30 mg/dL — AB
RBC / HPF: >50 RBC/hpf — ABNORMAL HIGH (ref 0–5)
Specific Gravity, Urine: 1.021 (ref 1.005–1.030)
pH: 5 (ref 5.0–8.0)

## 2022-01-18 LAB — CBG MONITORING, ED: Glucose-Capillary: 115 mg/dL — ABNORMAL HIGH (ref 70–99)

## 2022-01-18 LAB — BASIC METABOLIC PANEL
Anion gap: 9 (ref 5–15)
BUN: 10 mg/dL (ref 6–20)
CO2: 24 mmol/L (ref 22–32)
Calcium: 8.9 mg/dL (ref 8.9–10.3)
Chloride: 100 mmol/L (ref 98–111)
Creatinine, Ser: 0.59 mg/dL (ref 0.44–1.00)
GFR, Estimated: >60 mL/min (ref >60)
Glucose, Bld: 165 mg/dL — ABNORMAL HIGH (ref 70–99)
Potassium: 3.3 mmol/L — ABNORMAL LOW (ref 3.5–5.1)
Sodium: 133 mmol/L — ABNORMAL LOW (ref 135–145)

## 2022-01-18 LAB — I-STAT BETA HCG BLOOD, ED (MC, WL, AP ONLY): I-stat hCG, quantitative: <5 m[IU]/mL (ref <5)

## 2022-01-18 MED ORDER — TAMSULOSIN HCL 0.4 MG PO CAPS: 0.4000 mg | ORAL_CAPSULE | Freq: Every day | ORAL | 0 refills | Status: AC

## 2022-01-18 MED ORDER — SODIUM CHLORIDE 0.9 % IV BOLUS
1000.0000 mL | Freq: Once | INTRAVENOUS | Status: AC
  Administered 2022-01-18: 1000 mL via INTRAVENOUS

## 2022-01-18 MED ORDER — OXYCODONE HCL 5 MG PO TABS: 5.0000 mg | ORAL_TABLET | ORAL | 0 refills | Status: DC | PRN

## 2022-01-18 MED ORDER — TAMSULOSIN HCL 0.4 MG PO CAPS: 0.4000 mg | ORAL_CAPSULE | Freq: Every day | ORAL | 0 refills | Status: DC

## 2022-01-18 MED ORDER — MORPHINE SULFATE (PF) 4 MG/ML IV SOLN
4.0000 mg | Freq: Once | INTRAVENOUS | Status: AC
Start: 2022-01-18 — End: 2022-01-18
  Administered 2022-01-18: 4 mg via INTRAVENOUS
  Filled 2022-01-18: qty 1

## 2022-01-18 MED ORDER — ONDANSETRON HCL 4 MG PO TABS: 4.0000 mg | ORAL_TABLET | Freq: Three times a day (TID) | ORAL | 0 refills | Status: DC | PRN

## 2022-01-18 MED ORDER — SODIUM CHLORIDE 0.9 % IV SOLN
12.5000 mg | Freq: Four times a day (QID) | INTRAVENOUS | Status: DC | PRN
  Administered 2022-01-18: 12.5 mg via INTRAVENOUS
  Filled 2022-01-18: qty 12.5

## 2022-01-18 MED ORDER — ONDANSETRON HCL 4 MG/2ML IJ SOLN
4.0000 mg | Freq: Once | INTRAMUSCULAR | Status: AC
  Administered 2022-01-18: 4 mg via INTRAVENOUS
  Filled 2022-01-18: qty 2

## 2022-01-18 NOTE — Discharge Instructions (Addendum)
You were diagnosed today with a left-sided ureteral stone.  This is likely causing your pain and nausea.  Please take 600 mg of ibuprofen every 6 hours, 1000 mg acetaminophen every 6 hours.  I have also prescribed oxycodone for breakthrough pain to be taken as needed.  Zofran as nausea medication.  Please take it as directed for the first 24 hours and then as needed.  I have also prescribed Flomax to be taken as directed.  Follow-up with urology if needed for further evaluation and management.  If you become unable to tolerate any oral intake or if your pain becomes too severe to bear at home with pain medication, please return to the hospital for further evaluation

## 2022-01-18 NOTE — ED Notes (Signed)
Pt given PO fluids as ordered. Will continue to monitor.

## 2022-01-18 NOTE — ED Triage Notes (Signed)
Patient c/o left flank pain and states she has known kidney stones.

## 2022-01-18 NOTE — ED Notes (Signed)
Patient actively vomiting.  Wheeled to the bathroom and PA notified of patients symptoms.

## 2022-01-18 NOTE — ED Provider Notes (Signed)
[facility] DEPT Provider Note   CSN: [id number] Arrival date & time: 01/18/22  4782     History  Chief Complaint  Patient presents with   Flank Pain    Debra Cain is a 51 y.o. female.  Patient presents to the emergency department complaining of severe left-sided flank pain, dysuria, hematuria, nausea, and vomiting.  Patient states her symptoms began yesterday with dysuria and some left-sided flank pain which were much more mild in severity at that time.  She was seen in urgent care with recommendations for urology follow-up this morning or emergency department visit if symptoms worsen significantly.  Patient states that her symptoms worsened significantly overnight.  She does endorse knowledge of kidney stones on CT when she was evaluated by urology for interstitial cystitis in the past.  Patient denies chest pain, shortness of breath.  Past medical history significant for chronic interstitial cystitis, GERD, chronic cough, anxiety  HPI     Home Medications Prior to Admission medications   Medication Sig Start Date End Date Taking? Authorizing Provider  acetaminophen (TYLENOL) 500 MG tablet Take 1,000 mg by mouth daily as needed for mild pain or headache.   Yes [provider]  ALPRAZolam (XANAX) 0.25 MG tablet TAKE 1 TABLET(0.25 MG) BY MOUTH TWICE DAILY AS NEEDED FOR ANXIETY Patient taking differently: Take 0.25 mg by mouth 2 (two) times daily as needed for anxiety. 12/29/21  Yes Burns, Claudina Lick, MD  sertraline (ZOLOFT) 50 MG tablet Take 50 mg by mouth daily.   Yes [provider]  ondansetron (ZOFRAN) 4 MG tablet Take 1 tablet (4 mg total) by mouth every 8 (eight) hours as needed for nausea or vomiting. 01/18/22   Dorothyann Peng, PA-C  oxyCODONE (ROXICODONE) 5 MG immediate release tablet Take 1 tablet (5 mg total) by mouth every 4 (four) hours as needed for severe pain. 01/18/22   Dorothyann Peng, PA-C  sertraline (ZOLOFT) 100 MG  tablet TAKE 1 TABLET(100 MG) BY MOUTH DAILY Patient not taking: Reported on 01/18/2022 12/29/21   Binnie Rail, MD  tamsulosin (FLOMAX) 0.4 MG CAPS capsule Take 1 capsule (0.4 mg total) by mouth daily. 01/18/22 02/17/22  Dorothyann Peng, PA-C      Allergies    Augmentin [amoxicillin-pot clavulanate], Cipro [ciprofloxacin hcl], Levaquin [levofloxacin], Macrobid [nitrofurantoin macrocrystal], Minocin [minocycline], Sudafed [pseudoephedrine], Tussin [guaifenesin], and Effexor [venlafaxine]    Review of Systems   Review of Systems  Constitutional:  Negative for fever.  Respiratory:  Negative for shortness of breath.   Cardiovascular:  Negative for chest pain.  Gastrointestinal:  Positive for abdominal pain, nausea and vomiting. Negative for constipation and diarrhea.  Genitourinary:  Positive for dysuria, flank pain and hematuria.    Physical Exam Updated Vital Signs BP 115/89   Pulse 87   Temp 97.9 F (36.6 C) (Oral)   Resp 18   Ht '5\' 5"'$  (1.651 m)   Wt 54 kg   LMP 01/07/2022   SpO2 99%   BMI 19.80 kg/m  Physical Exam Vitals and nursing note reviewed.  Constitutional:      General: She is not in acute distress.    Appearance: She is well-developed.  HENT:     Head: Normocephalic and atraumatic.     Mouth/Throat:     Mouth: Mucous membranes are moist.  Eyes:     Conjunctiva/sclera: Conjunctivae normal.  Cardiovascular:     Rate and Rhythm: Normal rate and regular rhythm.     Heart  sounds: No murmur heard. Pulmonary:     Effort: Pulmonary effort is normal. No respiratory distress.     Breath sounds: Normal breath sounds.  Abdominal:     Palpations: Abdomen is soft.     Tenderness: There is no abdominal tenderness. There is left CVA tenderness. There is no right CVA tenderness.  Musculoskeletal:        General: No swelling.     Cervical back: Neck supple.  Skin:    General: Skin is warm and dry.     Capillary Refill: Capillary refill takes less than 2 seconds.   Neurological:     Mental Status: She is alert.  Psychiatric:        Mood and Affect: Mood normal.     ED Results / Procedures / Treatments   Labs (all labs ordered are listed, but only abnormal results are displayed) Labs Reviewed  URINALYSIS, ROUTINE W REFLEX MICROSCOPIC - Abnormal; Notable for the following components:      Result Value   APPearance HAZY (*)    Hgb urine dipstick LARGE (*)    Ketones, ur 5 (*)    Protein, ur 30 (*)    RBC / HPF >50 (*)    Bacteria, UA FEW (*)    All other components within normal limits  BASIC METABOLIC PANEL - Abnormal; Notable for the following components:   Sodium 133 (*)    Potassium 3.3 (*)    Glucose, Bld 165 (*)    All other components within normal limits  CBG MONITORING, ED - Abnormal; Notable for the following components:   Glucose-Capillary 115 (*)    All other components within normal limits  CBC WITH DIFFERENTIAL/PLATELET  I-STAT BETA HCG BLOOD, ED ([facility], [facility], [facility] ONLY)  CBG MONITORING, ED    EKG None  Radiology CT Renal Stone Study  Result Date: 01/18/2022 CLINICAL DATA:  Flank pain, suspected kidney stone EXAM: CT ABDOMEN AND PELVIS WITHOUT CONTRAST TECHNIQUE: Multidetector CT imaging of the abdomen and pelvis was performed following the standard protocol without IV contrast. RADIATION DOSE REDUCTION: This exam was performed according to the departmental dose-optimization program which includes automated exposure control, adjustment of the mA and/or kV according to patient size and/or use of iterative reconstruction technique. COMPARISON:  02/07/2021 FINDINGS: Lower chest: Lung bases clear Hepatobiliary: Small hepatic cysts. Gallbladder and liver otherwise normal appearance. Pancreas: Normal appearance Spleen: Normal appearance Adrenals/Urinary Tract: Adrenal glands normal appearance. BILATERAL nonobstructing renal calculi. No renal masses. No hydronephrosis or hydroureter. However, a new 3 mm calcification is seen at the LEFT  ureterovesical junction consistent with a nonobstructing UVJ calculus. No additional ureteral calculi. Stomach/Bowel: Appendix not visualized, by history surgically absent. Stomach and bowel loops normal appearance. Vascular/Lymphatic: Minimal atherosclerotic calcification aorta without aneurysm. No adenopathy. Reproductive: Unremarkable uterus and adnexa Other: No free air or free fluid. No hernia or inflammatory process. Musculoskeletal: Osseous structures unremarkable. IMPRESSION: BILATERAL nonobstructing renal calculi. New 3 mm calculus at LEFT ureterovesical junction consistent with a nonobstructing calculus. No associated hydronephrosis or hydroureter. Electronically Signed   By: Lavonia Dana M.D.   On: 01/18/2022 09:07    Procedures Procedures    Medications Ordered in ED Medications  promethazine (PHENERGAN) 12.5 mg in sodium chloride 0.9 % 50 mL IVPB (12.5 mg Intravenous New Bag/Given 01/18/22 1055)  morphine (PF) 4 MG/ML injection 4 mg (4 mg Intravenous Given 01/18/22 0804)  ondansetron (ZOFRAN) injection 4 mg (4 mg Intravenous Given 01/18/22 0804)  sodium chloride 0.9 % bolus 1,000  mL (1,000 mLs Intravenous New Bag/Given 01/18/22 1031)    ED Course/ Medical Decision Making/ A&P                           Medical Decision Making Amount and/or Complexity of Data Reviewed Labs: ordered. Radiology: ordered.  Risk Prescription drug management.   This patient presents to the ED for concern of left-sided flank pain, nausea, vomiting, this involves an extensive number of treatment options, and is a complaint that carries with it a high risk of complications and morbidity.  The differential diagnosis includes nephrolithiasis, pyelonephritis, cystitis, and others   Co morbidities that complicate the patient evaluation  History of chronic interstitial cystitis   Additional history obtained:   External records from outside source obtained and reviewed including urgent care notes  from yesterday   Lab Tests:  I Ordered, and personally interpreted labs.  The pertinent results include: Negative i-STAT beta pregnancy test, unremarkable CBC, unremarkable BMP, urinalysis with large hemoglobin, 5 ketones, 30 protein, few bacteria not indicative of infection   Imaging Studies ordered:  I ordered imaging studies including CT renal stone study I independently visualized and interpreted imaging which showed 3 mm left UVJ calculus with no hydronephrosis, no hydroureter I agree with the radiologist interpretation    Problem List / ED Course / Critical interventions / Medication management  I ordered medication including morphine for pain, Zofran for nausea Reevaluation of the patient after these medicines showed that the patient improved I have reviewed the patients home medicines and have made adjustments as needed   Test / Admission - Considered:  Patient has a ureteral stone on CT study.  No sign infection on UA.  No hydronephrosis.  The stone is 3 mm in size which should be amenable to passage of stone.  Plan to discharge patient home at this time with recommendation for NSAIDs, Tylenol, oxycodone as needed for breakthrough pain, Zofran, and Flomax.  Patient will follow-up with urology as needed.  Patient given return precautions including inability to tolerate oral intake and intractable pain  Prior to discharge paperwork being explained patient became dizzy and began to vomit.  This appeared to be a reaction to the morphine.  She felt no dizziness unless nausea prior to morphine administration.  IV reestablished and patient was administered a saline bolus along with Phenergan.  Patient allowed to rest for some time and passed p.o. challenge.  Patient feels better at this time.  Ready for discharge home.        Final Clinical Impression(s) / ED Diagnoses Final diagnoses:  Left ureteral stone  Left flank pain    Rx / DC Orders ED Discharge Orders           Ordered    oxyCODONE (ROXICODONE) 5 MG immediate release tablet  Every 4 hours PRN,   Status:  Discontinued        01/18/22 0930    ondansetron (ZOFRAN) 4 MG tablet  Every 8 hours PRN,   Status:  Discontinued        01/18/22 0930    tamsulosin (FLOMAX) 0.4 MG CAPS capsule  Daily,   Status:  Discontinued        01/18/22 0930    tamsulosin (FLOMAX) 0.4 MG CAPS capsule  Daily        01/18/22 1349    oxyCODONE (ROXICODONE) 5 MG immediate release tablet  Every 4 hours PRN  01/18/22 1349    ondansetron (ZOFRAN) 4 MG tablet  Every 8 hours PRN        01/18/22 1349              Ronny Bacon 01/18/22 1351    Tretha Sciara, MD 01/19/22 [phone]

## 2022-01-18 NOTE — ED Notes (Signed)
Patient feeling dizzy and lightheaded and doesn't want to leave yet.  Provided ginger ale and crackers and will reassess.

## 2022-01-18 NOTE — ED Notes (Signed)
An After Visit Summary was printed and given to the patient. Discharge instructions given and no further questions at this time.  Pt leaving with husband. 

## 2022-02-05 DIAGNOSIS — H31003 Unspecified chorioretinal scars, bilateral: Secondary | ICD-10-CM | POA: Diagnosis not present

## 2022-03-05 DIAGNOSIS — Z01419 Encounter for gynecological examination (general) (routine) without abnormal findings: Secondary | ICD-10-CM | POA: Diagnosis not present

## 2022-03-05 DIAGNOSIS — Z1231 Encounter for screening mammogram for malignant neoplasm of breast: Secondary | ICD-10-CM | POA: Diagnosis not present

## 2022-03-05 DIAGNOSIS — R87615 Unsatisfactory cytologic smear of cervix: Secondary | ICD-10-CM | POA: Diagnosis not present

## 2022-03-05 DIAGNOSIS — Z6820 Body mass index (BMI) 20.0-20.9, adult: Secondary | ICD-10-CM | POA: Diagnosis not present

## 2022-03-05 DIAGNOSIS — R87612 Low grade squamous intraepithelial lesion on cytologic smear of cervix (LGSIL): Secondary | ICD-10-CM | POA: Diagnosis not present

## 2022-03-26 DIAGNOSIS — R69 Illness, unspecified: Secondary | ICD-10-CM | POA: Diagnosis not present

## 2022-03-26 DIAGNOSIS — Z124 Encounter for screening for malignant neoplasm of cervix: Secondary | ICD-10-CM | POA: Diagnosis not present

## 2022-09-07 ENCOUNTER — Telehealth: Payer: Self-pay | Admitting: Internal Medicine

## 2022-09-07 NOTE — Telephone Encounter (Signed)
Pt stating she is having bad anxiety attacks and they is hitting back to back. Pt also states that she in on XANAX but it was a low dosage and its not helping and she need something to get her through the weekend. Pt really want someone to give her a call back with update.

## 2022-09-10 ENCOUNTER — Ambulatory Visit: Payer: 59 | Admitting: Internal Medicine

## 2022-09-10 ENCOUNTER — Encounter: Payer: Self-pay | Admitting: Internal Medicine

## 2022-09-10 VITALS — BP 136/90 | HR 106 | Temp 98.5°F | Ht 65.0 in | Wt 116.0 lb

## 2022-09-10 DIAGNOSIS — F419 Anxiety disorder, unspecified: Secondary | ICD-10-CM

## 2022-09-10 MED ORDER — SERTRALINE HCL 100 MG PO TABS: 100.0000 mg | ORAL_TABLET | Freq: Every day | ORAL | 3 refills | Status: DC

## 2022-09-10 MED ORDER — ALPRAZOLAM 0.25 MG PO TABS: ORAL_TABLET | ORAL | 5 refills | Status: DC

## 2022-09-10 NOTE — Telephone Encounter (Signed)
Patient has appointment today with Dr. Lawerance Bach

## 2022-09-10 NOTE — Patient Instructions (Addendum)
       Medications changes include :   increase sertraline to 100 mg daily   .     Return in about 6 months (around 03/12/2023) for follow up.   [facility] Phone: [phone]  Address: [address] [place], [place] [address] Open 24/7, No appointment required.   [facility]    - walk ins only for initial viist   M-F  8am - 3pm [address] [place], [place]  [phone]

## 2022-09-10 NOTE — Assessment & Plan Note (Addendum)
Having significant anxiety for the past couple of weeks Family wishes been able to calm herself down is to take intermittently cold showers and take the alprazolam She did start sertraline 50 mg daily recently from what she had leftover Increase sertraline 100 mg daily Continue alprazolam 0.25 mg twice daily as needed-can increase slightly if we can need to Discussed possibly seeing a therapist-she will look into if her insurance covers this Discussed her situations-her biggest concern right now is her friend and recommendations given She will call if there is no improvement in the next couple of weeks

## 2022-09-10 NOTE — Progress Notes (Signed)
      Subjective:    Patient ID: Debra Cain, female    DOB: 08-24-1970, 52 y.o.   MRN: [id number]     HPI Vanassa is here for follow up of her chronic medical problems.  Off sertraline x one year  This past week got very anxious-it is a combination of her struggling finanacially, mom has dementia and a close friend is battling depression and alcoholism.  She is having a tough time coping with all of this and is distraught.  2 days ago she did restart the sertraline that she had-it is old, but she knew she needed to get back on it.  She is taking the alprazolam as needed and that is helping, but she knows she cannot take it too much  Medications and allergies reviewed with patient and updated if appropriate.  No current outpatient medications on file prior to visit.   No current facility-administered medications on file prior to visit.     Review of Systems     Objective:   Vitals:   09/10/22 1351  BP: (!) 136/90  Pulse: (!) 106  Temp: 98.5 F (36.9 C)  SpO2: 99%   BP Readings from Last 3 Encounters:  09/10/22 (!) 136/90  01/18/22 115/89  01/17/22 123/84   Wt Readings from Last 3 Encounters:  09/10/22 116 lb (52.6 kg)  01/18/22 119 lb (54 kg)  01/17/22 119 lb (54 kg)   Body mass index is 19.3 kg/m.    Physical Exam Constitutional:      General: She is not in acute distress.    Appearance: Normal appearance. She is not ill-appearing.  HENT:     Head: Normocephalic and atraumatic.  Skin:    General: Skin is warm and dry.  Neurological:     Mental Status: She is alert.  Psychiatric:        Behavior: Behavior normal.        Thought Content: Thought content normal.        Judgment: Judgment normal.     Comments: Very anxious, crying intermittently        Lab Results  Component Value Date   WBC 9.0 01/18/2022   HGB 13.5 01/18/2022   HCT 39.2 01/18/2022   PLT 365 01/18/2022   GLUCOSE 165 (H) 01/18/2022   CHOL 166 10/19/2013   TRIG 39.0  10/19/2013   HDL 47.90 10/19/2013   LDLCALC 110 (H) 10/19/2013   ALT 11 01/25/2021   AST 10 (L) 01/25/2021   NA 133 (L) 01/18/2022   K 3.3 (L) 01/18/2022   CL 100 01/18/2022   CREATININE 0.59 01/18/2022   BUN 10 01/18/2022   CO2 24 01/18/2022   TSH 1.30 04/28/2018     Assessment & Plan:    See Problem List for Assessment and Plan of chronic medical problems.

## 2022-10-01 DIAGNOSIS — R87619 Unspecified abnormal cytological findings in specimens from cervix uteri: Secondary | ICD-10-CM | POA: Diagnosis not present

## 2022-10-01 LAB — HM PAP SMEAR: HM Pap smear: ABNORMAL

## 2022-10-01 LAB — RESULTS CONSOLE HPV: CHL HPV: NEGATIVE

## 2022-10-18 DIAGNOSIS — R87619 Unspecified abnormal cytological findings in specimens from cervix uteri: Secondary | ICD-10-CM | POA: Diagnosis not present

## 2022-12-19 ENCOUNTER — Encounter (HOSPITAL_BASED_OUTPATIENT_CLINIC_OR_DEPARTMENT_OTHER): Payer: Self-pay | Admitting: Obstetrics and Gynecology

## 2022-12-19 NOTE — H&P (Signed)
NAME: Debra Cain, Debra Cain MEDICAL RECORD NO: [id number] ACCOUNT NO: [id number] DATE OF BIRTH: Jun 25, 1970 PHYSICIAN: Juluis Mire, MD  History and Physical   DATE OF ADMISSION: 12/27/2022  Date of her surgery 12/27/2022.  HISTORY OF PRESENT ILLNESS:  The patient is a 52 year old female who comes in for hysteroscopy with MyoSure resection.  She had problems with endometrial cells on her Pap smear.  She underwent a saline infusion ultrasound that revealed a polypoid-like  out growths of the endometrium.  She was having heavy cycles.  She now presents for hysteroscopic resection of the above-noted polyp.  ALLERGIES:  She is allergic to Augmentin, Cipro, guaifenesin, Levaquin, Macrobid and minocycline.  MEDICATIONS:  She is on sertraline 100 mg tablet a day and Xanax as needed.  PAST MEDICAL HISTORY:  She does have a history of abnormal cervical cytology.  She underwent a LEEP of the cervix that did reveal low-grade dysplasia.  She has a history of mild stress incontinence, followed by urologist.  Does have a history of kidney  stones.  She is also followed for Sjogren's syndrome.  This is by her primary care doctor.  PAST SURGICAL HISTORY:  She has had an appendectomy and a loop electrical excision procedure of the cervix.  SOCIAL HISTORY:  Reveals no tobacco or alcohol use.  FAMILY HISTORY:  Noncontributory.  REVIEW OF SYSTEMS:  Noncontributory.  PHYSICAL EXAMINATION:. VITAL SIGNS:  Afebrile, stable vital signs. HEENT:  Patient is normocephalic.  Pupils equal, round, reactive to light and accommodation.  Extraocular movements were intact.  Sclerae and conjunctivae are clear.  Oropharynx clear. NECK:  Without thyromegaly. LUNGS:  Clear. CARDIOVASCULAR:  Regular rhythm and rate without murmurs or gallops. ABDOMEN:  Benign.  No mass, organomegaly, or tenderness.  On pelvic normal external genitalia vaginal mucosa is clear.  Cervix unremarkable.  Uterus normal size, shape, and contour.   Adnexa free of masses or tenderness. EXTREMITIES:  Trace edema. NEUROLOGIC:  Grossly within normal limits.  ASSESSMENT AND PLAN:  Endometrial polyp to undergo hysteroscopic resection.   PLAN: The patient undergo hysteroscopic evaluation and resection of polyp.  The risk of surgery have been discussed including risk of infection.  The risk of hemorrhage that could require transfusion with the risk of aids or hepatitis.  Risk of injury to  adjacent organs such as bladder, bowel, ureters that could require further exploratory surgery.  Risk of deep venous thrombosis and pulmonary embolus.  The patient expressed understanding of indications and risks.     Xaver.Mink D: 12/19/2022 7:16:14 am T: 12/19/2022 7:30:00 am  JOB: [id number]/ [id number]

## 2022-12-19 NOTE — H&P (Signed)
Patient name   gertrude, claywell DICTATION# [id number] KGM#[id number]  Richardean Chimera, MD 12/19/2022 7:17 AM

## 2022-12-19 NOTE — Progress Notes (Signed)
Spoke w/ via phone for pre-op interview--- Debra Cain needs dos---- UPT per anesthesia              Cain results------ COVID test -----patient states asymptomatic no test needed Arrive at -------1100 NPO after MN NO Solid Food.  Clear liquids from MN until---1000 Med rec completed Medications to take morning of surgery -----Zoloft Diabetic medication ----- Patient instructed no nail polish to be worn day of surgery Patient instructed to bring photo id and insurance card day of surgery Patient aware to have Driver (ride ) / caregiver  Husband Germain Osgood  for 24 hours after surgery  Patient Special Instructions ----- Pre-Op special Instructions ----- Patient verbalized understanding of instructions that were given at this phone interview. Patient denies shortness of breath, chest pain, fever, cough at this phone interview.

## 2022-12-27 ENCOUNTER — Ambulatory Visit (HOSPITAL_BASED_OUTPATIENT_CLINIC_OR_DEPARTMENT_OTHER)
Admission: RE | Admit: 2022-12-27 | Discharge: 2022-12-27 | Disposition: A | Discharge status: Home / Self Care | Payer: 59 | Attending: Obstetrics and Gynecology | Admitting: Obstetrics and Gynecology

## 2022-12-27 ENCOUNTER — Ambulatory Visit (HOSPITAL_BASED_OUTPATIENT_CLINIC_OR_DEPARTMENT_OTHER): Payer: 59 | Admitting: Certified Registered"

## 2022-12-27 ENCOUNTER — Other Ambulatory Visit: Payer: Self-pay

## 2022-12-27 ENCOUNTER — Encounter (HOSPITAL_BASED_OUTPATIENT_CLINIC_OR_DEPARTMENT_OTHER)
Admission: RE | Disposition: A | Discharge status: Home / Self Care | Payer: Self-pay | Source: Home / Self Care | Attending: Obstetrics and Gynecology

## 2022-12-27 ENCOUNTER — Encounter (HOSPITAL_BASED_OUTPATIENT_CLINIC_OR_DEPARTMENT_OTHER): Payer: Self-pay | Admitting: Obstetrics and Gynecology

## 2022-12-27 DIAGNOSIS — N840 Polyp of corpus uteri: Secondary | ICD-10-CM | POA: Diagnosis not present

## 2022-12-27 DIAGNOSIS — E162 Hypoglycemia, unspecified: Secondary | ICD-10-CM

## 2022-12-27 DIAGNOSIS — Z87442 Personal history of urinary calculi: Secondary | ICD-10-CM | POA: Insufficient documentation

## 2022-12-27 DIAGNOSIS — M3500 Sicca syndrome, unspecified: Secondary | ICD-10-CM | POA: Insufficient documentation

## 2022-12-27 DIAGNOSIS — Z9049 Acquired absence of other specified parts of digestive tract: Secondary | ICD-10-CM | POA: Insufficient documentation

## 2022-12-27 DIAGNOSIS — Z01818 Encounter for other preprocedural examination: Secondary | ICD-10-CM

## 2022-12-27 HISTORY — PX: DILATATION & CURETTAGE/HYSTEROSCOPY WITH MYOSURE: SHX6511

## 2022-12-27 HISTORY — DX: Nausea with vomiting, unspecified: R11.2

## 2022-12-27 HISTORY — DX: Other specified postprocedural states: Z98.890

## 2022-12-27 LAB — CBC
HCT: 38.3 % (ref 36.0–46.0)
Hemoglobin: 12.7 g/dL (ref 12.0–15.0)
MCH: 27.9 pg (ref 26.0–34.0)
MCHC: 33.2 g/dL (ref 30.0–36.0)
MCV: 84 fL (ref 80.0–100.0)
Platelets: 347 10*3/uL (ref 150–400)
RBC: 4.56 MIL/uL (ref 3.87–5.11)
RDW: 12.3 % (ref 11.5–15.5)
WBC: 6.8 10*3/uL (ref 4.0–10.5)
nRBC: 0 % (ref 0.0–0.2)

## 2022-12-27 LAB — ABO/RH: ABO/RH(D): O POS

## 2022-12-27 LAB — TYPE AND SCREEN
ABO/RH(D): O POS
Antibody Screen: NEGATIVE

## 2022-12-27 SURGERY — DILATATION & CURETTAGE/HYSTEROSCOPY WITH MYOSURE
Anesthesia: General | Site: Vagina

## 2022-12-27 MED ORDER — AMISULPRIDE (ANTIEMETIC) 5 MG/2ML IV SOLN: 10.0000 mg | Freq: Once | INTRAVENOUS | Status: DC | PRN

## 2022-12-27 MED ORDER — DEXAMETHASONE SODIUM PHOSPHATE 10 MG/ML IJ SOLN
INTRAMUSCULAR | Status: AC
  Filled 2022-12-27: qty 1

## 2022-12-27 MED ORDER — ACETAMINOPHEN 500 MG PO TABS
ORAL_TABLET | ORAL | Status: AC
  Filled 2022-12-27: qty 2

## 2022-12-27 MED ORDER — LACTATED RINGERS IV SOLN: INTRAVENOUS | Status: DC

## 2022-12-27 MED ORDER — ONDANSETRON HCL 4 MG/2ML IJ SOLN
INTRAMUSCULAR | Status: DC | PRN
  Administered 2022-12-27: 4 mg via INTRAVENOUS

## 2022-12-27 MED ORDER — DEXAMETHASONE SODIUM PHOSPHATE 10 MG/ML IJ SOLN
INTRAMUSCULAR | Status: DC | PRN
  Administered 2022-12-27: 10 mg via INTRAVENOUS

## 2022-12-27 MED ORDER — FENTANYL CITRATE (PF) 100 MCG/2ML IJ SOLN
INTRAMUSCULAR | Status: AC
  Filled 2022-12-27: qty 2

## 2022-12-27 MED ORDER — SODIUM CHLORIDE 0.9 % IR SOLN
Status: DC | PRN
  Administered 2022-12-27: 3000 mL

## 2022-12-27 MED ORDER — MIDAZOLAM HCL 2 MG/2ML IJ SOLN
INTRAMUSCULAR | Status: AC
  Filled 2022-12-27: qty 2

## 2022-12-27 MED ORDER — FENTANYL CITRATE (PF) 100 MCG/2ML IJ SOLN: 25.0000 ug | INTRAMUSCULAR | Status: DC | PRN

## 2022-12-27 MED ORDER — POVIDONE-IODINE 10 % EX SWAB: 2.0000 | Freq: Once | CUTANEOUS | Status: DC

## 2022-12-27 MED ORDER — FENTANYL CITRATE (PF) 100 MCG/2ML IJ SOLN
INTRAMUSCULAR | Status: DC | PRN
  Administered 2022-12-27: 25 ug via INTRAVENOUS
  Administered 2022-12-27: 50 ug via INTRAVENOUS

## 2022-12-27 MED ORDER — ONDANSETRON HCL 4 MG/2ML IJ SOLN
INTRAMUSCULAR | Status: AC
  Filled 2022-12-27: qty 2

## 2022-12-27 MED ORDER — PROPOFOL 10 MG/ML IV BOLUS
INTRAVENOUS | Status: DC | PRN
  Administered 2022-12-27: 160 mg via INTRAVENOUS

## 2022-12-27 MED ORDER — ACETAMINOPHEN 500 MG PO TABS
1000.0000 mg | ORAL_TABLET | Freq: Once | ORAL | Status: AC
  Administered 2022-12-27: 1000 mg via ORAL

## 2022-12-27 MED ORDER — MIDAZOLAM HCL 5 MG/5ML IJ SOLN
INTRAMUSCULAR | Status: DC | PRN
  Administered 2022-12-27: 2 mg via INTRAVENOUS

## 2022-12-27 MED ORDER — PROPOFOL 10 MG/ML IV BOLUS
INTRAVENOUS | Status: AC
  Filled 2022-12-27: qty 20

## 2022-12-27 MED ORDER — LIDOCAINE 2% (20 MG/ML) 5 ML SYRINGE
INTRAMUSCULAR | Status: DC | PRN
  Administered 2022-12-27: 80 mg via INTRAVENOUS

## 2022-12-27 MED ORDER — DEXMEDETOMIDINE HCL IN NACL 80 MCG/20ML IV SOLN
INTRAVENOUS | Status: DC | PRN
  Administered 2022-12-27 (×2): 4 ug via INTRAVENOUS

## 2022-12-27 MED ORDER — LIDOCAINE HCL (PF) 2 % IJ SOLN
INTRAMUSCULAR | Status: AC
  Filled 2022-12-27: qty 5

## 2022-12-27 MED ORDER — LIDOCAINE HCL 1 % IJ SOLN
INTRAMUSCULAR | Status: DC | PRN
  Administered 2022-12-27: 10 mL

## 2022-12-27 SURGICAL SUPPLY — 19 items
DEVICE MYOSURE LITE (MISCELLANEOUS) IMPLANT
GLOVE BIO SURGEON STRL SZ7 (GLOVE) ×6 IMPLANT
GLOVE BIOGEL PI IND STRL 7.0 (GLOVE) IMPLANT
GLOVE SURG SS PI 6.5 STRL IVOR (GLOVE) IMPLANT
GOWN STRL REUS W/ TWL LRG LVL3 (GOWN DISPOSABLE) IMPLANT
GOWN STRL REUS W/TWL LRG LVL3 (GOWN DISPOSABLE) ×1 IMPLANT
GOWN STRL REUS W/TWL XL LVL3 (GOWN DISPOSABLE) ×3 IMPLANT
IV NS IRRIG 3000ML ARTHROMATIC (IV SOLUTION) ×3 IMPLANT
KIT PROCEDURE FLUENT (KITS) ×3 IMPLANT
KIT TURNOVER CYSTO (KITS) ×3 IMPLANT
NDL SPNL 18GX3.5 QUINCKE PK (NEEDLE) ×3 IMPLANT
NEEDLE SPNL 18GX3.5 QUINCKE PK (NEEDLE) ×1 IMPLANT
PACK VAGINAL MINOR WOMEN LF (CUSTOM PROCEDURE TRAY) ×3 IMPLANT
PAD OB MATERNITY 4.3X12.25 (PERSONAL CARE ITEMS) ×3 IMPLANT
PAD PREP 24X48 CUFFED NSTRL (MISCELLANEOUS) ×3 IMPLANT
SEAL ROD LENS SCOPE MYOSURE (ABLATOR) ×3 IMPLANT
SLEEVE SCD COMPRESS KNEE MED (STOCKING) ×3 IMPLANT
SOL PREP POV-IOD 4OZ 10% (MISCELLANEOUS) IMPLANT
TOWEL OR 17X24 6PK STRL BLUE (TOWEL DISPOSABLE) ×6 IMPLANT

## 2022-12-27 NOTE — Discharge Instructions (Signed)
     No acetaminophen/Tylenol until after 5:30 pm today if needed.     Post Anesthesia Home Care Instructions  Activity: Get plenty of rest for the remainder of the day. A responsible individual must stay with you for 24 hours following the procedure.  For the next 24 hours, DO NOT: -Drive a car -Advertising copywriter -Drink alcoholic beverages -Take any medication unless instructed by your physician -Make any legal decisions or sign important papers.  Meals: Start with liquid foods such as gelatin or soup. Progress to regular foods as tolerated. Avoid greasy, spicy, heavy foods. If nausea and/or vomiting occur, drink only clear liquids until the nausea and/or vomiting subsides. Call your physician if vomiting continues.  Special Instructions/Symptoms: Your throat may feel dry or sore from the anesthesia or the breathing tube placed in your throat during surgery. If this causes discomfort, gargle with warm salt water. The discomfort should disappear within 24 hours.

## 2022-12-27 NOTE — Anesthesia Preprocedure Evaluation (Signed)
Anesthesia Evaluation  Patient identified by MRN, date of birth, ID band Patient awake    Reviewed: Allergy & Precautions, NPO status , Patient's Chart, lab work & pertinent test results  History of Anesthesia Complications (+) PONV and history of anesthetic complications  Airway Mallampati: II  TM Distance: >3 FB Neck ROM: Full    Dental   Pulmonary neg pulmonary ROS   breath sounds clear to auscultation       Cardiovascular negative cardio ROS  Rhythm:Regular Rate:Normal     Neuro/Psych   Anxiety     negative neurological ROS     GI/Hepatic Neg liver ROS,GERD  ,,  Endo/Other  negative endocrine ROS    Renal/GU negative Renal ROS     Musculoskeletal   Abdominal   Peds  Hematology negative hematology ROS (+)   Anesthesia Other Findings   Reproductive/Obstetrics                             Anesthesia Physical Anesthesia Plan  ASA: 2  Anesthesia Plan: General   Post-op Pain Management: Tylenol PO (pre-op)* and Toradol IV (intra-op)*   Induction: Intravenous  PONV Risk Score and Plan: 4 or greater and Dexamethasone, Midazolam, Ondansetron and Scopolamine patch - Pre-op  Airway Management Planned: LMA  Additional Equipment:   Intra-op Plan:   Post-operative Plan: Extubation in OR  Informed Consent: I have reviewed the patients History and Physical, chart, labs and discussed the procedure including the risks, benefits and alternatives for the proposed anesthesia with the patient or authorized representative who has indicated his/her understanding and acceptance.     Dental advisory given  Plan Discussed with: CRNA  Anesthesia Plan Comments:        Anesthesia Quick Evaluation

## 2022-12-27 NOTE — Brief Op Note (Signed)
12/27/2022  1:30 PM  PATIENT:  Debra Cain  52 y.o. female  PRE-OPERATIVE DIAGNOSIS:  POLYP  POST-OPERATIVE DIAGNOSIS:  POLYP  PROCEDURE:  Procedure(s): DILATATION & CURETTAGE/HYSTEROSCOPY WITH  MYOSURE (N/A)  SURGEON:  Surgeons and Role:    * Richardean Chimera, MD - Primary  PHYSICIAN ASSISTANT:   ASSISTANTS: none   ANESTHESIA:   general and paracervical block  EBL:  200cc       BLOOD ADMINISTERED:none  DRAINS: none   LOCAL MEDICATIONS USED:  XYLOCAINE   SPECIMEN:  Source of Specimen:  endometrial rescection and curretting  DISPOSITION OF SPECIMEN:  PATHOLOGY  COUNTS:  YES  TOURNIQUET:  * No tourniquets in log *  DICTATION: .Other Dictation: Dictation Number [id number]  PLAN OF CARE: Discharge to home after PACU  PATIENT DISPOSITION:  PACU - hemodynamically stable.   Delay start of Pharmacological VTE agent (>24hrs) due to surgical blood loss or risk of bleeding: no

## 2022-12-27 NOTE — H&P (Signed)
History and physical exam unchanged

## 2022-12-27 NOTE — Op Note (Signed)
NAME: [name], [name] MEDICAL RECORD NO: [id number] ACCOUNT NO: [id number] DATE OF BIRTH: 1970-10-03 FACILITY: [facility] LOCATION: [facility] PHYSICIAN: Juluis Mire, MD  Operative Report   DATE OF PROCEDURE: 12/27/2022  PREOPERATIVE DIAGNOSIS:  Endometrial polypoid out growth.  POSTOPERATIVE DIAGNOSIS:  Endometrial polypoid out growth.  PROCEDURE:  Paracervical block.  Cervical dilation with hysteroscopy.  Endometrial resection using the MyoSure of the endometrial polyp-like out growth.  Endometrial curettings.  SURGEON:  Juluis Mire, MD.  ANESTHESIA:  General.  ESTIMATED BLOOD LOSS:  Minimal.  PACKS AND DRAINS:  None.  INTRAOPERATIVE BLOOD PLACEMENT:  None.  COMPLICATIONS:  None.  INDICATIONS:  As dictated in the history and physical.  DESCRIPTION OF PROCEDURE:  As follows, the patient was taken to the OR and placed in supine position.  After a satisfactory level of general anesthesia obtained, the patient was placed in the dorsal lithotomy position using Allen stirrups.  Perineum and  vagina were prepped out with Betadine, draped in sterile field.  Speculum was placed in the vaginal vault.  The cervix was grasped with single tooth tenaculum.  Paracervical block of 1% Xylocaine was instituted.  Uterus sounded to 9 cm.  Cervix was  serially dilated.  Hysteroscope was introduced.  Intrauterine cavity was distended using saline.  Visualization revealed a slightly thickened endometrium posteriorly.  The MyoSure was brought in place.  This area was resected completely along with  multiple biopsies.  All this was sent to pathology.  The hysteroscope was then removed.  Endometrial curettings were then obtained.  Speculum was then removed from the single tooth tenaculum.  The patient was taken out of dorsal lithotomy position once  alert and extubated, transferred to recovery room in good condition.  Sponge, instrument and needle count were reported as correct by circulating  nurse.   NIK D: 12/27/2022 1:34:15 pm T: 12/27/2022 9:07:00 pm  JOB: [id number]/ [id number]

## 2022-12-27 NOTE — Transfer of Care (Signed)
Immediate Anesthesia Transfer of Care Note  Patient: Debra Cain  Procedure(s) Performed: DILATATION & CURETTAGE/HYSTEROSCOPY WITH  MYOSURE (Vagina )  Patient Location: PACU  Anesthesia Type:General  Level of Consciousness: awake, alert , and oriented  Airway & Oxygen Therapy: Patient Spontanous Breathing and Patient connected to nasal cannula oxygen  Post-op Assessment: Report given to RN and Post -op Vital signs reviewed and stable  Post vital signs: Reviewed and stable  Last Vitals:  Vitals Value Taken Time  BP 130/84 12/27/22 1334  Temp    Pulse 80 12/27/22 1336  Resp 18 12/27/22 1336  SpO2 100 % 12/27/22 1336  Vitals shown include unfiled device data.  Last Pain:  Vitals:   12/27/22 1121  TempSrc: Oral      Patients Stated Pain Goal: 5 (12/27/22 1121)  Complications: No notable events documented.

## 2022-12-27 NOTE — Anesthesia Procedure Notes (Signed)
Procedure Name: LMA Insertion Date/Time: 12/27/2022 1:06 PM  Performed by: Quamere Mussell D, CRNAPre-anesthesia Checklist: Patient identified, Emergency Drugs available, Suction available and Patient being monitored Patient Re-evaluated:Patient Re-evaluated prior to induction Oxygen Delivery Method: Circle system utilized Preoxygenation: Pre-oxygenation with 100% oxygen Induction Type: IV induction Ventilation: Mask ventilation without difficulty LMA: LMA inserted LMA Size: 4.0 Tube type: Oral Number of attempts: 1 Placement Confirmation: positive ETCO2 and breath sounds checked- equal and bilateral Tube secured with: Tape Dental Injury: Teeth and Oropharynx as per pre-operative assessment

## 2022-12-28 ENCOUNTER — Encounter (HOSPITAL_BASED_OUTPATIENT_CLINIC_OR_DEPARTMENT_OTHER): Payer: Self-pay | Admitting: Obstetrics and Gynecology

## 2022-12-28 NOTE — Anesthesia Postprocedure Evaluation (Signed)
Anesthesia Post Note  Patient: Debra Cain  Procedure(s) Performed: DILATATION & CURETTAGE/HYSTEROSCOPY WITH  MYOSURE (Vagina )     Patient location during evaluation: PACU Anesthesia Type: General Level of consciousness: awake and alert Pain management: pain level controlled Vital Signs Assessment: post-procedure vital signs reviewed and stable Respiratory status: spontaneous breathing, nonlabored ventilation, respiratory function stable and patient connected to nasal cannula oxygen Cardiovascular status: blood pressure returned to baseline and stable Postop Assessment: no apparent nausea or vomiting Anesthetic complications: no   No notable events documented.  Last Vitals:  Vitals:   12/27/22 1415 12/27/22 1425  BP:  119/82  Pulse: 81 76  Resp: (!) 22 12  Temp:  36.6 C  SpO2: 97% 98%    Last Pain:  Vitals:   12/27/22 1425  TempSrc:   PainSc: 0-No pain                 Kennieth Rad

## 2022-12-31 LAB — SURGICAL PATHOLOGY

## 2023-01-10 ENCOUNTER — Encounter: Payer: Self-pay | Admitting: Internal Medicine

## 2023-01-11 ENCOUNTER — Other Ambulatory Visit: Payer: Self-pay | Admitting: Internal Medicine

## 2023-01-11 DIAGNOSIS — Z1212 Encounter for screening for malignant neoplasm of rectum: Secondary | ICD-10-CM

## 2023-01-11 DIAGNOSIS — Z1211 Encounter for screening for malignant neoplasm of colon: Secondary | ICD-10-CM

## 2023-01-24 ENCOUNTER — Telehealth: Payer: Self-pay | Admitting: Internal Medicine

## 2023-01-24 NOTE — Telephone Encounter (Signed)
Pt states she received a cologuard kit in the mail but has already had a colonoscopy this year with [facility]. She is unsure if we ordered the kit for her and if she really needs to use it since she already had the procedure.   Please call pt and advise: [phone]

## 2023-01-25 NOTE — Telephone Encounter (Signed)
Message left for patient.  She does not need to do Cologuard since she had colonscopy done with [facility].  I will fax them and get copy of report.

## 2023-03-11 ENCOUNTER — Other Ambulatory Visit: Payer: Self-pay | Admitting: Obstetrics and Gynecology

## 2023-03-11 DIAGNOSIS — Z1151 Encounter for screening for human papillomavirus (HPV): Secondary | ICD-10-CM | POA: Diagnosis not present

## 2023-03-11 DIAGNOSIS — N6320 Unspecified lump in the left breast, unspecified quadrant: Secondary | ICD-10-CM

## 2023-03-11 DIAGNOSIS — Z681 Body mass index (BMI) 19 or less, adult: Secondary | ICD-10-CM | POA: Diagnosis not present

## 2023-03-11 DIAGNOSIS — Z124 Encounter for screening for malignant neoplasm of cervix: Secondary | ICD-10-CM | POA: Diagnosis not present

## 2023-03-11 DIAGNOSIS — Z808 Family history of malignant neoplasm of other organs or systems: Secondary | ICD-10-CM | POA: Diagnosis not present

## 2023-03-11 DIAGNOSIS — Z01419 Encounter for gynecological examination (general) (routine) without abnormal findings: Secondary | ICD-10-CM | POA: Diagnosis not present

## 2023-03-14 ENCOUNTER — Encounter: Payer: Self-pay | Admitting: Internal Medicine

## 2023-03-14 NOTE — Patient Instructions (Addendum)
       Medications changes include :   None      Return in about 1 year (around 03/14/2024) for Physical Exam.

## 2023-03-14 NOTE — Progress Notes (Signed)
Subjective:    Patient ID: Debra Cain, female    DOB: April 03, 1971, 52 y.o.   MRN: [id number]     HPI Marielys is here for follow up of her chronic medical problems.  Itchy scalp and hair loss.  Mom has psoriasis of scalp.  She was not sure was related to that or something else.  While she was here she did recall that she was using Rogaine in this area and wondered if that was the cause.  Cramping intermittent.  - just in legs and hands.  She does not always drink enough fluids throughout the day.   Has electricity that goes through her legs has been going on for years - it is very painful.  It shoots through her legs.  Only occurs in evening when she is sitting still.  It will happen - wait 50 sec then stop - then do it again -- then she walks around and it gets better.  Then if she sits she will happen again.  It occurs in her legs and arms.  Only when she sits in the evening and is still.  Occurs daily -  while watching tv or laying in bed.  May last an hour.     Has heavy menses.    Medications and allergies reviewed with patient and updated if appropriate.  No current outpatient medications on file prior to visit.   No current facility-administered medications on file prior to visit.     Review of Systems  Musculoskeletal:        Leg cramping  Skin:        Hair loss, itchy scalp  Neurological:        Nerve pain in arms and legs       Objective:   Vitals:   03/15/23 1408  BP: 114/80  Pulse: 89  Temp: 98 F (36.7 C)  SpO2: 98%   BP Readings from Last 3 Encounters:  03/15/23 114/80  12/27/22 119/82  09/10/22 (!) 136/90   Wt Readings from Last 3 Encounters:  03/15/23 120 lb (54.4 kg)  12/27/22 116 lb 12.8 oz (53 kg)  09/10/22 116 lb (52.6 kg)   Body mass index is 19.97 kg/m.    Physical Exam Constitutional:      General: She is not in acute distress.    Appearance: Normal appearance.  HENT:     Head: Normocephalic and atraumatic.  Eyes:      Conjunctiva/sclera: Conjunctivae normal.  Cardiovascular:     Rate and Rhythm: Normal rate and regular rhythm.     Heart sounds: Normal heart sounds.  Pulmonary:     Effort: Pulmonary effort is normal. No respiratory distress.     Breath sounds: Normal breath sounds. No wheezing.  Musculoskeletal:     Cervical back: Neck supple.     Right lower leg: No edema.     Left lower leg: No edema.  Lymphadenopathy:     Cervical: No cervical adenopathy.  Skin:    General: Skin is warm and dry.     Findings: No rash.  Neurological:     Mental Status: She is alert. Mental status is at baseline.  Psychiatric:        Mood and Affect: Mood normal.        Behavior: Behavior normal.        Lab Results  Component Value Date   WBC 6.8 12/27/2022   HGB 12.7 12/27/2022   HCT 38.3 12/27/2022  PLT 347 12/27/2022   GLUCOSE 165 (H) 01/18/2022   CHOL 166 10/19/2013   TRIG 39.0 10/19/2013   HDL 47.90 10/19/2013   LDLCALC 110 (H) 10/19/2013   ALT 11 01/25/2021   AST 10 (L) 01/25/2021   NA 133 (L) 01/18/2022   K 3.3 (L) 01/18/2022   CL 100 01/18/2022   CREATININE 0.59 01/18/2022   BUN 10 01/18/2022   CO2 24 01/18/2022   TSH 1.30 04/28/2018     Assessment & Plan:    See Problem List for Assessment and Plan of chronic medical problems.

## 2023-03-15 ENCOUNTER — Encounter: Payer: Self-pay | Admitting: Internal Medicine

## 2023-03-15 ENCOUNTER — Ambulatory Visit: Payer: 59 | Admitting: Internal Medicine

## 2023-03-15 VITALS — BP 114/80 | HR 89 | Temp 98.0°F | Ht 65.0 in | Wt 120.0 lb

## 2023-03-15 DIAGNOSIS — G2581 Restless legs syndrome: Secondary | ICD-10-CM | POA: Insufficient documentation

## 2023-03-15 DIAGNOSIS — L299 Pruritus, unspecified: Secondary | ICD-10-CM | POA: Insufficient documentation

## 2023-03-15 DIAGNOSIS — F419 Anxiety disorder, unspecified: Secondary | ICD-10-CM | POA: Diagnosis not present

## 2023-03-15 DIAGNOSIS — R252 Cramp and spasm: Secondary | ICD-10-CM | POA: Insufficient documentation

## 2023-03-15 MED ORDER — SERTRALINE HCL 100 MG PO TABS: 100.0000 mg | ORAL_TABLET | Freq: Every day | ORAL | 1 refills | Status: DC

## 2023-03-15 MED ORDER — ALPRAZOLAM 0.25 MG PO TABS: ORAL_TABLET | ORAL | 5 refills | Status: DC

## 2023-03-15 NOTE — Assessment & Plan Note (Signed)
Chronic intermittent Controlled, stable Sertraline 50 mg daily Continue xanax 0.25 mg bid prn

## 2023-03-15 NOTE — Assessment & Plan Note (Signed)
Chronic Has been having symptoms for years but never brought it up Has electric-like pain in arms and legs only when sitting watching TV at night or in bed Symptoms consistent with possible RLS Has heavy menses so she may have iron deficiency Discussed that we could do blood work-deferred for today She will start taking slow release iron OTC and see if that helps

## 2023-03-15 NOTE — Assessment & Plan Note (Signed)
Acute Primarily on the top of her head Associated with some hair thinning Has been using Rogaine which could be the cause of the symptoms-she will stop this and see if her symptoms improve Scalp looks normal-no redness or dryness Could try one of her mom's shampoos if there is no improvement after stopping the Rogaine Neck step would be to see dermatology

## 2023-03-15 NOTE — Assessment & Plan Note (Signed)
Acute Having muscle cramping in arms and legs Does not drink much fluids and possibly related to dehydration-encouraged to push fluids Discussed other possible causes such as electrolyte deficiency

## 2023-03-22 ENCOUNTER — Ambulatory Visit
Admission: RE | Admit: 2023-03-22 | Discharge: 2023-03-22 | Disposition: A | Discharge status: Home / Self Care | Payer: 59 | Source: Ambulatory Visit | Attending: Obstetrics and Gynecology | Admitting: Obstetrics and Gynecology

## 2023-03-22 DIAGNOSIS — N6320 Unspecified lump in the left breast, unspecified quadrant: Secondary | ICD-10-CM

## 2023-03-22 DIAGNOSIS — N6459 Other signs and symptoms in breast: Secondary | ICD-10-CM | POA: Diagnosis not present

## 2023-03-25 DIAGNOSIS — Z9189 Other specified personal risk factors, not elsewhere classified: Secondary | ICD-10-CM | POA: Diagnosis not present

## 2023-03-25 DIAGNOSIS — N63 Unspecified lump in unspecified breast: Secondary | ICD-10-CM | POA: Diagnosis not present

## 2023-03-25 DIAGNOSIS — Z809 Family history of malignant neoplasm, unspecified: Secondary | ICD-10-CM | POA: Diagnosis not present

## 2023-03-25 DIAGNOSIS — N930 Postcoital and contact bleeding: Secondary | ICD-10-CM | POA: Diagnosis not present

## 2023-03-26 ENCOUNTER — Other Ambulatory Visit: Payer: Self-pay | Admitting: Obstetrics and Gynecology

## 2023-03-26 DIAGNOSIS — Z800 Family history of malignant neoplasm of digestive organs: Secondary | ICD-10-CM

## 2023-06-17 IMAGING — CR DG ABDOMEN 2V
2 series · 2 of 2 positions shown · non-contrast
Comparison: January 25, 2021

CLINICAL DATA: Recent colonoscopy with pain since procedure.

EXAM:
ABDOMEN - 2 VIEW

[w abdomen upright]
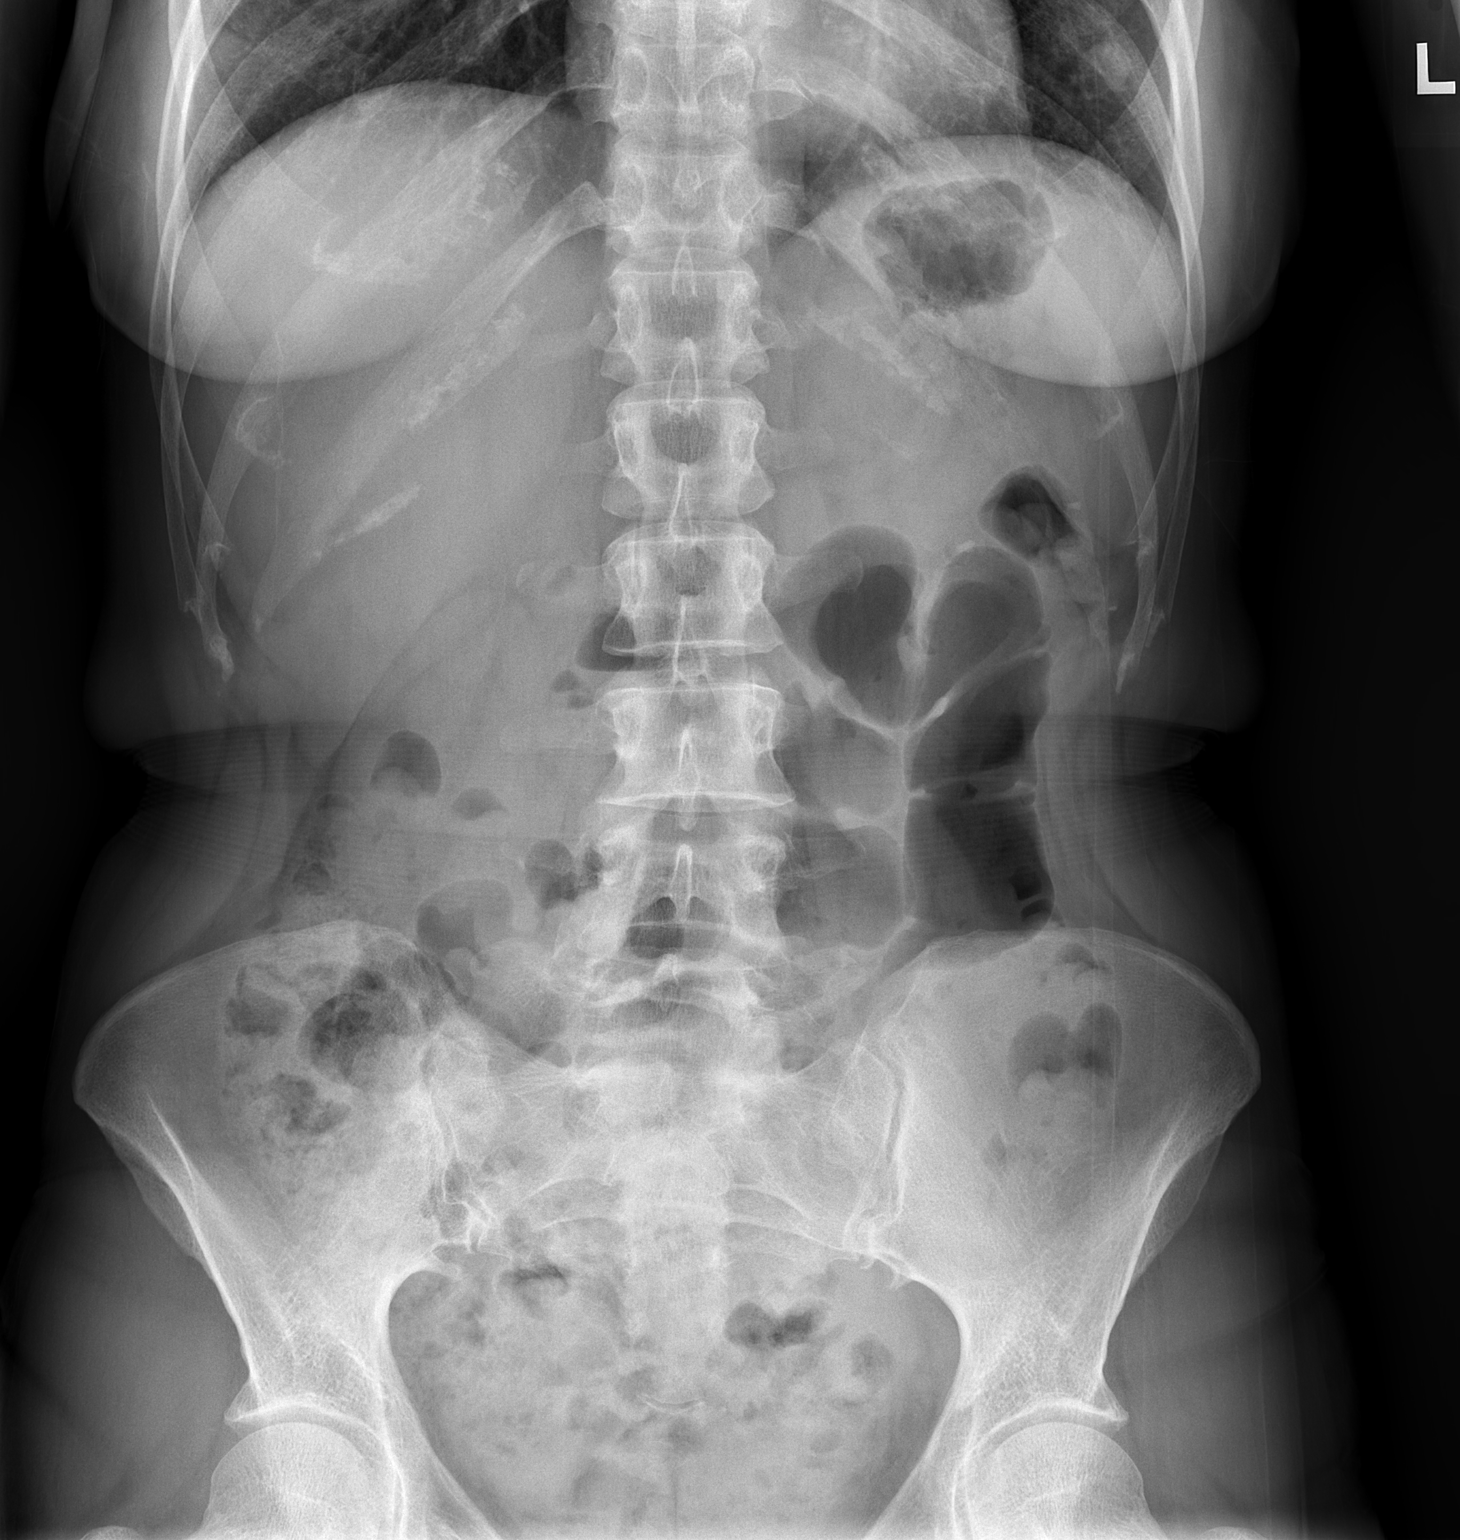

[t abdomen supine]
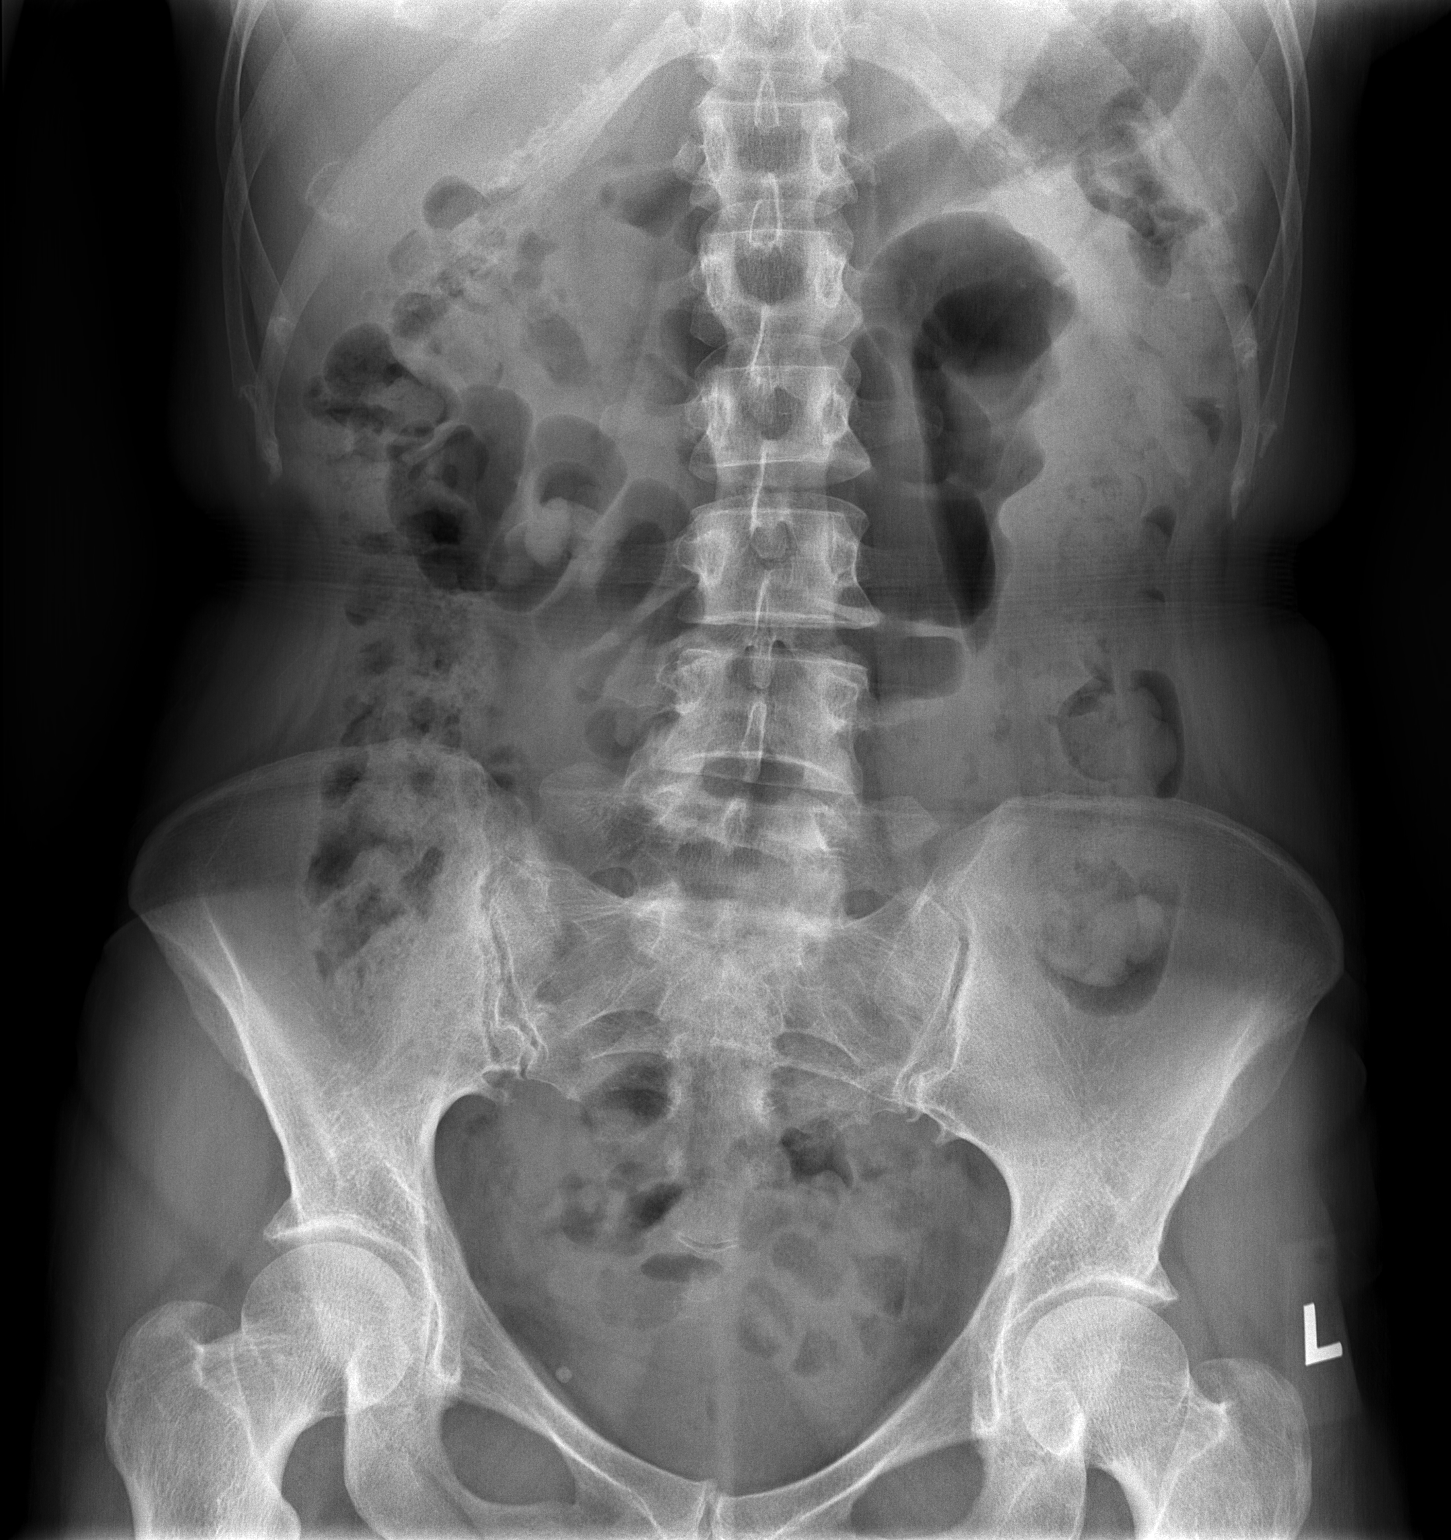

[2 of 2 positions shown; findings below may reference images not displayed]

FINDINGS: The bowel gas pattern is normal. A moderate to marked amount of
stool is seen throughout the large bowel. There is no evidence of
free air. No radio-opaque calculi or other significant radiographic
abnormality is seen.
IMPRESSION: Moderate to marked stool burden without evidence of bowel
obstruction. Further evaluation with abdomen and pelvis CT is
recommended if clinical symptoms persist.

## 2023-10-14 ENCOUNTER — Other Ambulatory Visit: Payer: Self-pay | Admitting: Internal Medicine

## 2024-03-11 ENCOUNTER — Other Ambulatory Visit: Payer: Self-pay | Admitting: Obstetrics and Gynecology

## 2024-03-11 DIAGNOSIS — N6321 Unspecified lump in the left breast, upper outer quadrant: Secondary | ICD-10-CM

## 2024-03-17 ENCOUNTER — Inpatient Hospital Stay
Admission: RE | Admit: 2024-03-17 | Discharge: 2024-03-17 | Payer: Self-pay | Attending: Obstetrics and Gynecology | Admitting: Obstetrics and Gynecology

## 2024-03-17 ENCOUNTER — Inpatient Hospital Stay
Admission: RE | Admit: 2024-03-17 | Discharge: 2024-03-17 | Attending: Obstetrics and Gynecology | Admitting: Obstetrics and Gynecology

## 2024-03-17 DIAGNOSIS — N6321 Unspecified lump in the left breast, upper outer quadrant: Secondary | ICD-10-CM

## 2024-03-20 ENCOUNTER — Encounter: Payer: 59 | Admitting: Internal Medicine

## 2024-05-11 ENCOUNTER — Encounter: Payer: Self-pay | Admitting: Internal Medicine

## 2024-05-11 ENCOUNTER — Encounter: Admitting: Internal Medicine

## 2024-05-11 NOTE — Progress Notes (Unsigned)
 "   Subjective:    Patient ID: Debra Cain, female    DOB: [birth year], 54 y.o.   MRN: [id number]      HPI Debra Cain is here for a Physical exam and her chronic medical problems.   Overall doing well.  Denies any changes.  She continues to not sleep well which is very frustrating.  Sometimes she will not fall asleep until 6:00 in the morning.  He would like to avoid medication, but it makes it difficult for her to function with consistent lack of sleep.   Medications and allergies reviewed with patient and updated if appropriate.  Medications Ordered Prior to Encounter[1]  Review of Systems  Constitutional:  Negative for fever.  HENT:  Positive for tinnitus.   Eyes:  Negative for visual disturbance.  Respiratory:  Positive for cough. Negative for shortness of breath and wheezing.   Cardiovascular:  Negative for chest pain, palpitations and leg swelling.  Gastrointestinal:  Negative for abdominal pain, blood in stool, constipation and diarrhea.       No gerd  Genitourinary:  Negative for dysuria.  Musculoskeletal:  Positive for back pain (chronic, mild). Negative for arthralgias.       Cramping in legs and hands, nerve twinges of pain in legs  Skin:  Negative for rash.  Neurological:  Negative for light-headedness and headaches.  Psychiatric/Behavioral:  Positive for sleep disturbance. Negative for dysphoric mood. The patient is nervous/anxious (controlled).        Objective:   Vitals:   05/12/24 1048  BP: 112/70  Pulse: 80  Temp: 98 F (36.7 C)  SpO2: 98%   Filed Weights   05/12/24 1048  Weight: 124 lb (56.2 kg)   Body mass index is 20.63 kg/m.  BP Readings from Last 3 Encounters:  05/12/24 112/70  03/15/23 114/80  12/27/22 119/82    Wt Readings from Last 3 Encounters:  05/12/24 124 lb (56.2 kg)  03/15/23 120 lb (54.4 kg)  12/27/22 116 lb 12.8 oz (53 kg)       Physical Exam Constitutional: She appears well-developed and well-nourished. No distress.   HENT:  Head: Normocephalic and atraumatic.  Right Ear: External ear normal. Normal ear canal and TM Left Ear: External ear normal.  Normal ear canal and TM Mouth/Throat: Oropharynx is clear and moist.  Eyes: Conjunctivae normal.  Neck: Neck supple. No tracheal deviation present. No thyromegaly present.  No carotid bruit  Cardiovascular: Normal rate, regular rhythm and normal heart sounds.   No murmur heard.  No edema. Pulmonary/Chest: Effort normal and breath sounds normal. No respiratory distress. She has no wheezes. She has no rales.  Breast: deferred   Abdominal: Soft. She exhibits no distension. There is no tenderness.  Lymphadenopathy: She has no cervical adenopathy.  Skin: Skin is warm and dry. She is not diaphoretic.  Psychiatric: She has a normal mood and affect. Her behavior is normal.     Lab Results  Component Value Date   WBC 6.8 12/27/2022   HGB 12.7 12/27/2022   HCT 38.3 12/27/2022   PLT 347 12/27/2022   GLUCOSE 165 (H) 01/18/2022   CHOL 166 10/19/2013   TRIG 39.0 10/19/2013   HDL 47.90 10/19/2013   LDLCALC 110 (H) 10/19/2013   ALT 11 01/25/2021   AST 10 (L) 01/25/2021   NA 133 (L) 01/18/2022   K 3.3 (L) 01/18/2022   CL 100 01/18/2022   CREATININE 0.59 01/18/2022   BUN 10 01/18/2022   CO2 24 01/18/2022  TSH 1.30 04/28/2018         Assessment & Plan:   Physical exam: Screening blood work  ordered Exercise  clogging Weight  normla Substance abuse  none   Reviewed recommended immunizations.   Health Maintenance  Topic Date Due   Colonoscopy  Never done   Zoster Vaccines- Shingrix (1 of 2) Never done   COVID-19 Vaccine (1) 05/27/2024 (Originally 09/27/1971)   DTaP/Tdap/Td (2 - Tdap) 05/12/2025 (Originally 07/31/2014)   Pneumococcal Vaccine: 50+ Years (1 of 2 - PCV) 05/12/2025 (Originally 03/30/1991)   Hepatitis B Vaccines 19-59 Average Risk (1 of 3 - 19+ 3-dose series) 05/12/2025 (Originally 03/30/1991)   Mammogram  03/17/2026   Cervical  Cancer Screening (HPV/Pap Cotest)  10/01/2027   HPV VACCINES (No Doses Required) Completed   Meningococcal B Vaccine  Aged Out   Influenza Vaccine  Discontinued   Hepatitis C Screening  Discontinued   HIV Screening  Discontinued          See Problem List for Assessment and Plan of chronic medical problems.        [1]  Current Outpatient Medications on File Prior to Visit  Medication Sig Dispense Refill   Multiple Vitamins-Minerals (HAIR SKIN & NAILS PO) Take by mouth.     No current facility-administered medications on file prior to visit.   "

## 2024-05-12 ENCOUNTER — Ambulatory Visit: Admitting: Internal Medicine

## 2024-05-12 ENCOUNTER — Ambulatory Visit: Payer: Self-pay | Admitting: Internal Medicine

## 2024-05-12 VITALS — BP 112/70 | HR 80 | Temp 98.0°F | Ht 65.0 in | Wt 124.0 lb

## 2024-05-12 DIAGNOSIS — R252 Cramp and spasm: Secondary | ICD-10-CM | POA: Diagnosis not present

## 2024-05-12 DIAGNOSIS — G479 Sleep disorder, unspecified: Secondary | ICD-10-CM | POA: Insufficient documentation

## 2024-05-12 DIAGNOSIS — R053 Chronic cough: Secondary | ICD-10-CM

## 2024-05-12 DIAGNOSIS — Z0000 Encounter for general adult medical examination without abnormal findings: Secondary | ICD-10-CM | POA: Diagnosis not present

## 2024-05-12 DIAGNOSIS — N920 Excessive and frequent menstruation with regular cycle: Secondary | ICD-10-CM | POA: Insufficient documentation

## 2024-05-12 DIAGNOSIS — F419 Anxiety disorder, unspecified: Secondary | ICD-10-CM

## 2024-05-12 LAB — COMPREHENSIVE METABOLIC PANEL WITH GFR
ALT: 8 U/L (ref 3–35)
AST: 11 U/L (ref 5–37)
Albumin: 4.3 g/dL (ref 3.5–5.2)
Alkaline Phosphatase: 66 U/L (ref 39–117)
BUN: 10 mg/dL (ref 6–23)
CO2: 28 meq/L (ref 19–32)
Calcium: 9 mg/dL (ref 8.4–10.5)
Chloride: 102 meq/L (ref 96–112)
Creatinine, Ser: 0.5 mg/dL (ref 0.40–1.20)
GFR: 108.06 mL/min
Glucose, Bld: 82 mg/dL (ref 70–99)
Potassium: 3.8 meq/L (ref 3.5–5.1)
Sodium: 139 meq/L (ref 135–145)
Total Bilirubin: 0.6 mg/dL (ref 0.2–1.2)
Total Protein: 7.3 g/dL (ref 6.0–8.3)

## 2024-05-12 LAB — LIPID PANEL
Cholesterol: 184 mg/dL (ref 28–200)
HDL: 40.6 mg/dL
LDL Cholesterol: 129 mg/dL — ABNORMAL HIGH (ref 10–99)
NonHDL: 143.81
Total CHOL/HDL Ratio: 5
Triglycerides: 74 mg/dL (ref 10.0–149.0)
VLDL: 14.8 mg/dL (ref 0.0–40.0)

## 2024-05-12 LAB — CBC
HCT: 37.6 % (ref 36.0–46.0)
Hemoglobin: 12.8 g/dL (ref 12.0–15.0)
MCHC: 34.1 g/dL (ref 30.0–36.0)
MCV: 80.7 fl (ref 78.0–100.0)
Platelets: 360 10*3/uL (ref 150.0–400.0)
RBC: 4.65 Mil/uL (ref 3.87–5.11)
RDW: 13.2 % (ref 11.5–15.5)
WBC: 6.3 10*3/uL (ref 4.0–10.5)

## 2024-05-12 LAB — FERRITIN: Ferritin: 13.4 ng/mL (ref 10.0–291.0)

## 2024-05-12 LAB — TSH: TSH: 2.14 u[IU]/mL (ref 0.35–5.50)

## 2024-05-12 LAB — MAGNESIUM: Magnesium: 2 mg/dL (ref 1.5–2.5)

## 2024-05-12 LAB — IBC PANEL
Iron: 116 ug/dL (ref 42–145)
Saturation Ratios: 27 % (ref 20.0–50.0)
TIBC: 429.8 ug/dL (ref 250.0–450.0)
Transferrin: 307 mg/dL (ref 212.0–360.0)

## 2024-05-12 MED ORDER — ESZOPICLONE 2 MG PO TABS: 2.0000 mg | ORAL_TABLET | Freq: Every evening | ORAL | 0 refills | Status: AC | PRN

## 2024-05-12 MED ORDER — ALPRAZOLAM 0.25 MG PO TABS: ORAL_TABLET | ORAL | 5 refills | Status: AC

## 2024-05-12 MED ORDER — SERTRALINE HCL 100 MG PO TABS: 100.0000 mg | ORAL_TABLET | Freq: Every day | ORAL | 3 refills | Status: AC

## 2024-05-12 NOTE — Assessment & Plan Note (Signed)
 Chronic Has not been on any medication in the past Continues to have difficulty sleeping-difficulty falling asleep in addition to staying asleep Worse recently likely because she is perimenopausal Discussed options Hopefully she can take something few nights a week but not nightly Start Lunesta  2 mg at bedtime as needed

## 2024-05-12 NOTE — Assessment & Plan Note (Signed)
 Has been experiencing irregular menses, but they are heavier Check iron panel, CBC to rule out mild anemia

## 2024-05-12 NOTE — Assessment & Plan Note (Addendum)
 Chronic Controlled, stable Sertraline  100 mg daily Continue xanax  0.25 mg bid prn

## 2024-05-12 NOTE — Assessment & Plan Note (Signed)
 Debra Cain Has been experiencing cramping in her hands and legs She does feel like she drinks plenty of fluids-stressed good hydration Check magnesium level, CMP

## 2024-09-07 ENCOUNTER — Encounter: Admitting: Internal Medicine

## 2025-05-14 ENCOUNTER — Encounter: Admitting: Internal Medicine
